# Patient Record
Sex: Female | Born: 1990 | Race: Black or African American | Hispanic: No | Marital: Single | Smoking: Never smoker
Health system: Southern US, Community
[De-identification: ages and names within clinical notes are randomized; demographics above are authoritative.]

## PROBLEM LIST (undated history)

## (undated) ENCOUNTER — Inpatient Hospital Stay (HOSPITAL_COMMUNITY): Payer: Self-pay

## (undated) DIAGNOSIS — Z8632 Personal history of gestational diabetes: Secondary | ICD-10-CM

## (undated) DIAGNOSIS — O1490 Unspecified pre-eclampsia, unspecified trimester: Secondary | ICD-10-CM

## (undated) DIAGNOSIS — E119 Type 2 diabetes mellitus without complications: Secondary | ICD-10-CM

## (undated) HISTORY — PX: NO PAST SURGERIES: SHX2092

## (undated) HISTORY — DX: Personal history of gestational diabetes: Z86.32

## (undated) HISTORY — DX: Unspecified pre-eclampsia, unspecified trimester: O14.90

---

## 2012-07-12 ENCOUNTER — Emergency Department (INDEPENDENT_AMBULATORY_CARE_PROVIDER_SITE_OTHER)
Admission: EM | Admit: 2012-07-12 | Discharge: 2012-07-12 | Disposition: A | Discharge status: Home / Self Care | Payer: Self-pay | Source: Home / Self Care | Attending: Emergency Medicine | Admitting: Emergency Medicine

## 2012-07-12 ENCOUNTER — Encounter (HOSPITAL_COMMUNITY): Payer: Self-pay | Admitting: *Deleted

## 2012-07-12 DIAGNOSIS — Z331 Pregnant state, incidental: Secondary | ICD-10-CM

## 2012-07-12 DIAGNOSIS — B373 Candidiasis of vulva and vagina: Secondary | ICD-10-CM

## 2012-07-12 DIAGNOSIS — Z3490 Encounter for supervision of normal pregnancy, unspecified, unspecified trimester: Secondary | ICD-10-CM

## 2012-07-12 LAB — POCT URINALYSIS DIP (DEVICE)
Glucose, UA: NEGATIVE mg/dL
Hgb urine dipstick: NEGATIVE
Nitrite: NEGATIVE
Urobilinogen, UA: 0.2 mg/dL (ref 0.0–1.0)
pH: 6.5 (ref 5.0–8.0)

## 2012-07-12 LAB — WET PREP, GENITAL: Clue Cells Wet Prep HPF POC: NONE SEEN

## 2012-07-12 MED ORDER — CLOTRIMAZOLE 100 MG VA TABS: 1.0000 | ORAL_TABLET | Freq: Every day | VAGINAL | Status: DC

## 2012-07-12 MED ORDER — PRENATAL VITAMINS (DIS) PO TABS: 1.0000 | ORAL_TABLET | Freq: Every day | ORAL | Status: DC

## 2012-07-12 NOTE — ED Provider Notes (Signed)
History     CSN: [id number]  Arrival date & time 07/12/12  1432   First MD Initiated Contact with Patient 07/12/12 1703      Chief Complaint  Patient presents with  . Vaginal Discharge    (Consider location/radiation/quality/duration/timing/severity/associated sxs/prior treatment) HPI Comments: After pt period was over last month, pt noticed white vaginal discharge that still persists.  Also vaginal itching.    Patient is a 21 y.o. female presenting with vaginal discharge. The history is provided by the patient.  Vaginal Discharge This is a new problem. Episode onset: a month ago. The problem occurs constantly. The problem has not changed since onset.Pertinent negatives include no abdominal pain. Nothing aggravates the symptoms. Nothing relieves the symptoms. She has tried nothing for the symptoms.    History reviewed. No pertinent past medical history.  History reviewed. No pertinent past surgical history.  History reviewed. No pertinent family history.  History  Substance Use Topics  . Smoking status: Never Smoker   . Smokeless tobacco: Not on file  . Alcohol Use: No    OB History    Grav Para Term Preterm Abortions TAB SAB Ect Mult Living                  Review of Systems  Constitutional: Negative for fever and chills.  Gastrointestinal: Negative for nausea, vomiting and abdominal pain.  Genitourinary: Positive for vaginal discharge. Negative for dysuria, flank pain and vaginal bleeding.    Allergies  Review of patient's allergies indicates no known allergies.  Home Medications   Current Outpatient Rx  Name Route Sig Dispense Refill  . CLOTRIMAZOLE 100 MG VA TABS Vaginal Place 1 tablet (100 mg total) vaginally at bedtime. 7 each 0  . PRENATAL VITAMINS (DIS) PO TABS Oral Take 1 tablet by mouth daily. 30 tablet 12    BP 127/71  Pulse 82  Temp 98.4 F (36.9 C) (Oral)  Resp 16  SpO2 100%  LMP 06/03/2012  Physical Exam  Constitutional: She appears  well-developed and well-nourished. No distress.  Cardiovascular: Normal rate and regular rhythm.   Pulmonary/Chest: Effort normal and breath sounds normal.  Abdominal: Soft. Bowel sounds are normal. She exhibits no distension. There is no tenderness. There is no guarding and no CVA tenderness.  Genitourinary: Uterus normal. There is no rash, tenderness or lesion on the right labia. There is no rash, tenderness or lesion on the left labia. Cervix exhibits no motion tenderness and no discharge. Right adnexum displays no mass and no tenderness. Left adnexum displays no mass and no tenderness. No tenderness around the vagina. Vaginal discharge found.    ED Course  Procedures (including critical care time)  Labs Reviewed  POCT URINALYSIS DIP (DEVICE) - Abnormal; Notable for the following:    Leukocytes, UA TRACE (*)  Biochemical Testing Only. Please order routine urinalysis from main lab if confirmatory testing is needed.   All other components within normal limits  POCT PREGNANCY, URINE - Abnormal; Notable for the following:    Preg Test, Ur POSITIVE (*)     All other components within normal limits  WET PREP, GENITAL - Abnormal; Notable for the following:    Yeast Wet Prep HPF POC FEW (*)     WBC, Wet Prep HPF POC MODERATE (*)     All other components within normal limits  GC/CHLAMYDIA PROBE AMP, GENITAL   No results found.   1. Pregnancy   2. Candida vaginitis       MDM  Pt with unplanned pregnancy, discussed options.  Pt wishes to keep pregnancy.  Referred to health dept, rx prenatal vitamins.  Given LMP and uterine size, likely early pregnancy.         Cathlyn Parsons, NP 07/12/12 1736

## 2012-07-12 NOTE — ED Notes (Signed)
Pt with c/o vaginal discharge x one month with itching intermittently denies odor - denies urinary symptoms - pt last menstrual cycle June 19th

## 2012-07-13 NOTE — ED Provider Notes (Addendum)
Medical screening examination/treatment/procedure(s) were performed by non-physician practitioner and as supervising physician I was immediately available for consultation/collaboration.  Leslee Home, M.D.   Reuben Likes, MD 07/13/12 1257  The patient's Chlamydia test came back positive. Her GC test was negative. We will inform the patient and treat with azithromycin 1000 mg as a single dose.  Reuben Likes, MD 07/14/12 [phone]

## 2012-07-14 ENCOUNTER — Telehealth (HOSPITAL_COMMUNITY): Payer: Self-pay | Admitting: *Deleted

## 2012-07-14 MED ORDER — AZITHROMYCIN 500 MG PO TABS: ORAL_TABLET | ORAL | Status: DC

## 2012-07-14 NOTE — ED Notes (Signed)
GC neg., Chlamydia pos., Wet prep: few yeast, mod. WBC's. Pt. adequately treated with Clotrimazole vag. for yeast but needs tx. for Chlamydia.  Lab shown to Dr. Lorenz Coaster.  He e-prescribed Zithromax 1 gm po x 1 dose to the [facility] on [address]. I called pt. and left message to call. Pt. called right back. Pt. verified x 2 and given results.  Pt. instructed to notify her partner, no sex for 1 week and to practice safe sex. Pt. told they can get HIV testing at the [facility]. STD clinic by appointment only.  Pt. told she can pick up the Rx. at [facility] on [address].  Take with a full glass of water and food. Call back if she vomits up the medication. Pt. voiced understanding.  DHHS form completed and faxed to the [facility]. Vassie Moselle 07/14/2012

## 2012-08-26 ENCOUNTER — Encounter (HOSPITAL_COMMUNITY): Payer: Self-pay | Admitting: Emergency Medicine

## 2012-08-26 DIAGNOSIS — R109 Unspecified abdominal pain: Secondary | ICD-10-CM | POA: Insufficient documentation
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
## 2012-08-26 NOTE — ED Notes (Signed)
Unable to void at this time.

## 2012-08-27 ENCOUNTER — Emergency Department (HOSPITAL_COMMUNITY)
Admission: EM | Admit: 2012-08-27 | Discharge: 2012-08-27 | Disposition: A | Discharge status: Home / Self Care | Payer: Medicaid Other | Attending: Emergency Medicine | Admitting: Emergency Medicine

## 2012-08-27 DIAGNOSIS — R109 Unspecified abdominal pain: Secondary | ICD-10-CM

## 2012-08-27 LAB — URINALYSIS, ROUTINE W REFLEX MICROSCOPIC
Nitrite: NEGATIVE
Specific Gravity, Urine: 1.014 (ref 1.005–1.030)
Urobilinogen, UA: 0.2 mg/dL (ref 0.0–1.0)

## 2012-08-27 LAB — POCT PREGNANCY, URINE: Preg Test, Ur: POSITIVE — AB

## 2012-08-27 LAB — URINE MICROSCOPIC-ADD ON

## 2012-08-27 MED ORDER — AZITHROMYCIN 250 MG PO TABS
1000.0000 mg | ORAL_TABLET | Freq: Once | ORAL | Status: AC
Start: 2012-08-27 — End: 2012-08-27
  Administered 2012-08-27: 1000 mg via ORAL
  Filled 2012-08-27: qty 4
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
## 2014-04-01 ENCOUNTER — Encounter (HOSPITAL_COMMUNITY): Payer: Self-pay | Admitting: Emergency Medicine

## 2014-04-01 ENCOUNTER — Emergency Department (HOSPITAL_COMMUNITY)
Admission: EM | Admit: 2014-04-01 | Discharge: 2014-04-02 | Disposition: A | Discharge status: Home / Self Care | Payer: Medicaid Other | Attending: Emergency Medicine | Admitting: Emergency Medicine

## 2014-04-01 DIAGNOSIS — IMO0001 Reserved for inherently not codable concepts without codable children: Secondary | ICD-10-CM | POA: Insufficient documentation

## 2014-04-01 DIAGNOSIS — R6889 Other general symptoms and signs: Secondary | ICD-10-CM

## 2014-04-01 DIAGNOSIS — Z3202 Encounter for pregnancy test, result negative: Secondary | ICD-10-CM | POA: Insufficient documentation

## 2014-04-01 DIAGNOSIS — R51 Headache: Secondary | ICD-10-CM | POA: Insufficient documentation

## 2014-04-01 DIAGNOSIS — R52 Pain, unspecified: Secondary | ICD-10-CM | POA: Insufficient documentation

## 2014-04-01 DIAGNOSIS — R1013 Epigastric pain: Secondary | ICD-10-CM | POA: Insufficient documentation

## 2014-04-01 DIAGNOSIS — R110 Nausea: Secondary | ICD-10-CM | POA: Insufficient documentation

## 2014-04-01 DIAGNOSIS — I10 Essential (primary) hypertension: Secondary | ICD-10-CM | POA: Insufficient documentation

## 2014-04-01 LAB — COMPREHENSIVE METABOLIC PANEL
ALK PHOS: 69 U/L (ref 39–117)
ALT: 14 U/L (ref 0–35)
AST: 19 U/L (ref 0–37)
Albumin: 4.1 g/dL (ref 3.5–5.2)
BUN: 9 mg/dL (ref 6–23)
CALCIUM: 9.7 mg/dL (ref 8.4–10.5)
CO2: 21 meq/L (ref 19–32)
Chloride: 107 mEq/L (ref 96–112)
Creatinine, Ser: 0.76 mg/dL (ref 0.50–1.10)
GFR calc Af Amer: >90 mL/min (ref >90)
GFR calc non Af Amer: >90 mL/min (ref >90)
GLUCOSE: 101 mg/dL — AB (ref 70–99)
POTASSIUM: 3.7 meq/L (ref 3.7–5.3)
SODIUM: 142 meq/L (ref 137–147)
TOTAL PROTEIN: 7.7 g/dL (ref 6.0–8.3)
Total Bilirubin: 0.2 mg/dL — ABNORMAL LOW (ref 0.3–1.2)

## 2014-04-01 LAB — CBC WITH DIFFERENTIAL/PLATELET
BASOS ABS: 0 10*3/uL (ref 0.0–0.1)
Basophils Relative: 0 % (ref 0–1)
EOS ABS: 0 10*3/uL (ref 0.0–0.7)
EOS PCT: 0 % (ref 0–5)
HCT: 39.6 % (ref 36.0–46.0)
Hemoglobin: 13.1 g/dL (ref 12.0–15.0)
LYMPHS ABS: 0.9 10*3/uL (ref 0.7–4.0)
LYMPHS PCT: 19 % (ref 12–46)
MCH: 27.4 pg (ref 26.0–34.0)
MCHC: 33.1 g/dL (ref 30.0–36.0)
MCV: 82.8 fL (ref 78.0–100.0)
Monocytes Absolute: 0.3 10*3/uL (ref 0.1–1.0)
Monocytes Relative: 7 % (ref 3–12)
NEUTROS PCT: 74 % (ref 43–77)
Neutro Abs: 3.4 10*3/uL (ref 1.7–7.7)
PLATELETS: 333 10*3/uL (ref 150–400)
RBC: 4.78 MIL/uL (ref 3.87–5.11)
RDW: 14.5 % (ref 11.5–15.5)
WBC: 4.6 10*3/uL (ref 4.0–10.5)

## 2014-04-01 LAB — LIPASE, BLOOD: Lipase: 38 U/L (ref 11–59)

## 2014-04-01 NOTE — ED Notes (Signed)
PT presents with body aches and HA onset 4 days ago. N/v/d present past few days.

## 2014-04-01 NOTE — ED Notes (Signed)
PT states she is holding down liquids PO at this time. Complains of HA and stomach cramping, body aches. Mucous membranes moist, no furrowing, skin non-tenting

## 2014-04-01 NOTE — ED Notes (Signed)
Patient presents with body aches, cough, nausea and headache.  Has been going on for 3 days.

## 2014-04-02 LAB — URINALYSIS, ROUTINE W REFLEX MICROSCOPIC
Bilirubin Urine: NEGATIVE
GLUCOSE, UA: NEGATIVE mg/dL
HGB URINE DIPSTICK: NEGATIVE
Ketones, ur: 15 mg/dL — AB
LEUKOCYTES UA: NEGATIVE
Nitrite: NEGATIVE
PH: 7 (ref 5.0–8.0)
Protein, ur: NEGATIVE mg/dL
Specific Gravity, Urine: 1.029 (ref 1.005–1.030)
Urobilinogen, UA: 1 mg/dL (ref 0.0–1.0)

## 2014-04-02 LAB — PREGNANCY, URINE: Preg Test, Ur: NEGATIVE

## 2014-04-02 MED ORDER — ONDANSETRON 8 MG PO TBDP: 8.0000 mg | ORAL_TABLET | Freq: Three times a day (TID) | ORAL | Status: DC | PRN

## 2014-04-02 MED ORDER — LABETALOL HCL 5 MG/ML IV SOLN: 40.0000 mg | Freq: Once | INTRAVENOUS | Status: DC

## 2014-04-02 MED ORDER — LABETALOL HCL 5 MG/ML IV SOLN: 80.0000 mg | Freq: Once | INTRAVENOUS | Status: DC

## 2014-04-02 MED ORDER — LABETALOL HCL 5 MG/ML IV SOLN: 20.0000 mg | Freq: Once | INTRAVENOUS | Status: DC

## 2014-04-02 MED ORDER — IBUPROFEN 400 MG PO TABS: 400.0000 mg | ORAL_TABLET | Freq: Four times a day (QID) | ORAL | Status: DC | PRN

## 2014-04-02 MED ORDER — METOCLOPRAMIDE HCL 5 MG/ML IJ SOLN
10.0000 mg | Freq: Once | INTRAMUSCULAR | Status: AC
  Administered 2014-04-02: 10 mg via INTRAVENOUS
  Filled 2014-04-02: qty 2

## 2014-04-02 MED ORDER — SODIUM CHLORIDE 0.9 % IV BOLUS (SEPSIS)
1000.0000 mL | Freq: Once | INTRAVENOUS | Status: AC
  Administered 2014-04-02: 1000 mL via INTRAVENOUS

## 2014-04-02 MED ORDER — KETOROLAC TROMETHAMINE 30 MG/ML IJ SOLN
30.0000 mg | Freq: Once | INTRAMUSCULAR | Status: AC
  Administered 2014-04-02: 30 mg via INTRAVENOUS
  Filled 2014-04-02: qty 1

## 2014-04-02 NOTE — Discharge Instructions (Signed)
Viral Infections °A virus is a type of germ. Viruses can cause: °· Minor sore throats. °· Aches and pains. °· Headaches. °· Runny nose. °· Rashes. °· Watery eyes. °· Tiredness. °· Coughs. °· Loss of appetite. °· Feeling sick to your stomach (nausea). °· Throwing up (vomiting). °· Watery poop (diarrhea). °HOME CARE  °· Only take medicines as told by your doctor. °· Drink enough water and fluids to keep your pee (urine) clear or pale yellow. Sports drinks are a good choice. °· Get plenty of rest and eat healthy. Soups and broths with crackers or rice are fine. °GET HELP RIGHT AWAY IF:  °· You have a very bad headache. °· You have shortness of breath. °· You have chest pain or neck pain. °· You have an unusual rash. °· You cannot stop throwing up. °· You have watery poop that does not stop. °· You cannot keep fluids down. °· You or your child has a temperature by mouth above 102° F (38.9° C), not controlled by medicine. °· Your baby is older than 3 months with a rectal temperature of 102° F (38.9° C) or higher. °· Your baby is 3 months old or younger with a rectal temperature of 100.4° F (38° C) or higher. °MAKE SURE YOU:  °· Understand these instructions. °· Will watch this condition. °· Will get help right away if you are not doing well or get worse. °Document Released: 11/14/2008 Document Revised: 02/24/2012 Document Reviewed: 04/09/2011 °Exit[employer]® Patient Information ©2014 [employer]. ° °

## 2014-04-02 NOTE — ED Provider Notes (Signed)
CSN: [id number]     Arrival date & time 04/01/14  2044 History   First MD Initiated Contact with Patient 04/01/14 2305     Chief Complaint  Patient presents with  . Generalized Body Aches  . Headache  . Nausea     (Consider location/radiation/quality/duration/timing/severity/associated sxs/prior Treatment) HPI Comments: Pt comes in w/ headache and body aches. Generalized body aches x 2 days, with malaise. Headache started 1 day aho. No fevers. No vomiting, visual complains, seizures, altered mental status, loss of consciousness, new weakness, or numbness, no gait instability.   Patient is a 23 y.o. female presenting with headaches. The history is provided by the patient.  Headache Pain location:  L temporal Quality:  Sharp Radiates to:  Does not radiate Severity currently:  7/10 Severity at highest:  9/10 Onset quality:  Gradual Duration:  1 day Timing:  Constant Progression:  Waxing and waning Chronicity:  New Similar to prior headaches: yes   Context: not exposure to bright light   Relieved by:  None tried Ineffective treatments:  None tried Associated symptoms: abdominal pain, myalgias and nausea   Associated symptoms: no near-syncope, no neck pain, no neck stiffness, no numbness, no paresthesias, no photophobia, no seizures, no visual change and no vomiting     Past Medical History  Diagnosis Date  . Hypertension    History reviewed. No pertinent past surgical history. History reviewed. No pertinent family history. History  Substance Use Topics  . Smoking status: Never Smoker   . Smokeless tobacco: Never Used  . Alcohol Use: No   OB History   Grav Para Term Preterm Abortions TAB SAB Ect Mult Living   1              Review of Systems  Constitutional: Positive for activity change.  Eyes: Negative for photophobia.  Respiratory: Negative for shortness of breath.   Cardiovascular: Negative for chest pain and near-syncope.  Gastrointestinal: Positive for nausea  and abdominal pain. Negative for vomiting.  Genitourinary: Negative for dysuria.  Musculoskeletal: Positive for myalgias. Negative for neck pain and neck stiffness.  Neurological: Positive for headaches. Negative for seizures, numbness and paresthesias.  All other systems reviewed and are negative.     Allergies  Review of patient's allergies indicates no known allergies.  Home Medications   Prior to Admission medications   Medication Sig Start Date End Date Taking? Authorizing Provider  medroxyPROGESTERone (DEPO-SUBQ PROVERA) 104 MG/0.65ML injection Inject 104 mg into the skin every 3 (three) months.   Yes Historical Provider, MD  ibuprofen (ADVIL,MOTRIN) 400 MG tablet Take 1 tablet (400 mg total) by mouth every 6 (six) hours as needed. 04/02/14   Derwood KaplanAnkit Cornisha Zetino, MD  ondansetron (ZOFRAN ODT) 8 MG disintegrating tablet Take 1 tablet (8 mg total) by mouth every 8 (eight) hours as needed for nausea. 04/02/14   Zenovia Justman Rhunette CroftNanavati, MD   BP 127/92  Pulse 95  Temp(Src) 98.5 F (36.9 C) (Oral)  Resp 25  Ht 5\' 4"  (1.626 m)  Wt 150 lb (68.04 kg)  BMI 25.73 kg/m2  SpO2 100%  LMP 03/25/2014  Breastfeeding? Unknown Physical Exam  Nursing note and vitals reviewed. Constitutional: She is oriented to person, place, and time. She appears well-developed and well-nourished.  HENT:  Head: Normocephalic and atraumatic.  Eyes: EOM are normal. Pupils are equal, round, and reactive to light.  Neck: Neck supple.  Cardiovascular: Normal rate, regular rhythm and normal heart sounds.   No murmur heard. Pulmonary/Chest: Effort normal. No respiratory distress.  Abdominal: Soft. She exhibits no distension. There is tenderness. There is no rebound and no guarding.  epigastric  Neurological: She is alert and oriented to person, place, and time.  Skin: Skin is warm and dry.    ED Course  Procedures (including critical care time) Labs Review Labs Reviewed  COMPREHENSIVE METABOLIC PANEL - Abnormal; Notable  for the following:    Glucose, Bld 101 (*)    Total Bilirubin 0.2 (*)    All other components within normal limits  URINALYSIS, ROUTINE W REFLEX MICROSCOPIC - Abnormal; Notable for the following:    Ketones, ur 15 (*)    All other components within normal limits  CBC WITH DIFFERENTIAL  LIPASE, BLOOD  PREGNANCY, URINE    Imaging Review No results found.   EKG Interpretation None      MDM   Final diagnoses:  Flu-like symptoms    Pt comes in with headaches, body aches, and malaise. No source of infection illcited on hx - although she does report a dry cough and some mild uri like sx, and mild epigastric pain and nausea.  Headache resolved with iv fluids and headache cocktails. She has no red flags on hx or exam suggestive of intracranial infections. Labs are reassuring, or at least not concerning.  Instruction on flu like syndrome, with return precautions have been discussed.     Derwood KaplanAnkit Sherrise Liberto, MD 04/02/14 [phone]

## 2014-10-17 ENCOUNTER — Encounter (HOSPITAL_COMMUNITY): Payer: Self-pay | Admitting: Emergency Medicine

## 2015-05-27 ENCOUNTER — Emergency Department (HOSPITAL_COMMUNITY)
Admission: EM | Admit: 2015-05-27 | Discharge: 2015-05-28 | Disposition: A | Discharge status: Home / Self Care | Payer: Medicaid Other | Attending: Emergency Medicine | Admitting: Emergency Medicine

## 2015-05-27 ENCOUNTER — Emergency Department (HOSPITAL_COMMUNITY): Payer: Medicaid Other

## 2015-05-27 ENCOUNTER — Encounter (HOSPITAL_COMMUNITY): Payer: Self-pay | Admitting: Emergency Medicine

## 2015-05-27 DIAGNOSIS — W25XXXA Contact with sharp glass, initial encounter: Secondary | ICD-10-CM | POA: Insufficient documentation

## 2015-05-27 DIAGNOSIS — Y9289 Other specified places as the place of occurrence of the external cause: Secondary | ICD-10-CM | POA: Insufficient documentation

## 2015-05-27 DIAGNOSIS — I10 Essential (primary) hypertension: Secondary | ICD-10-CM | POA: Insufficient documentation

## 2015-05-27 DIAGNOSIS — Y9389 Activity, other specified: Secondary | ICD-10-CM | POA: Insufficient documentation

## 2015-05-27 DIAGNOSIS — S71111A Laceration without foreign body, right thigh, initial encounter: Secondary | ICD-10-CM | POA: Insufficient documentation

## 2015-05-27 DIAGNOSIS — Z23 Encounter for immunization: Secondary | ICD-10-CM | POA: Insufficient documentation

## 2015-05-27 DIAGNOSIS — S71131A Puncture wound without foreign body, right thigh, initial encounter: Secondary | ICD-10-CM

## 2015-05-27 DIAGNOSIS — S8991XA Unspecified injury of right lower leg, initial encounter: Secondary | ICD-10-CM | POA: Insufficient documentation

## 2015-05-27 DIAGNOSIS — Z793 Long term (current) use of hormonal contraceptives: Secondary | ICD-10-CM | POA: Insufficient documentation

## 2015-05-27 DIAGNOSIS — Y998 Other external cause status: Secondary | ICD-10-CM | POA: Insufficient documentation

## 2015-05-27 DIAGNOSIS — T1490XA Injury, unspecified, initial encounter: Secondary | ICD-10-CM

## 2015-05-27 DIAGNOSIS — S71132A Puncture wound without foreign body, left thigh, initial encounter: Secondary | ICD-10-CM | POA: Insufficient documentation

## 2015-05-27 MED ORDER — IBUPROFEN 800 MG PO TABS
800.0000 mg | ORAL_TABLET | Freq: Once | ORAL | Status: AC
  Administered 2015-05-27: 800 mg via ORAL
  Filled 2015-05-27: qty 1

## 2015-05-27 MED ORDER — NAPROXEN 500 MG PO TABS: 500.0000 mg | ORAL_TABLET | Freq: Two times a day (BID) | ORAL | Status: DC

## 2015-05-27 MED ORDER — CEPHALEXIN 500 MG PO CAPS: 500.0000 mg | ORAL_CAPSULE | Freq: Two times a day (BID) | ORAL | Status: DC

## 2015-05-27 MED ORDER — TETANUS-DIPHTH-ACELL PERTUSSIS 5-2.5-18.5 LF-MCG/0.5 IM SUSP
0.5000 mL | Freq: Once | INTRAMUSCULAR | Status: AC
  Administered 2015-05-27: 0.5 mL via INTRAMUSCULAR
  Filled 2015-05-27: qty 0.5

## 2015-05-27 MED ORDER — BACITRACIN ZINC 500 UNIT/GM EX OINT: 1.0000 "application " | TOPICAL_OINTMENT | Freq: Two times a day (BID) | CUTANEOUS | Status: DC

## 2015-05-27 MED ORDER — BACITRACIN 500 UNIT/GM EX OINT
1.0000 | TOPICAL_OINTMENT | Freq: Once | CUTANEOUS | Status: AC
Start: 2015-05-27 — End: 2015-05-27
  Administered 2015-05-27: 1 via TOPICAL
  Filled 2015-05-27: qty 28.4

## 2015-05-27 NOTE — Discharge Instructions (Signed)
Please call your doctor for a followup appointment within 24-48 hours. When you talk to your doctor please let them know that you were seen in the emergency department and have them acquire all of your records so that they can discuss the findings with you and formulate a treatment plan to fully care for your new and ongoing problems. ° °

## 2015-05-27 NOTE — ED Notes (Signed)
Per EMS:  Pt was carrying a broken mirror down the stairs to the trash today when she struck both knees, causing puncture marks to both.  Laceration to left knee less severe than right.  Hemorrhage controlled at this time.  Pt cannot recall her last tetanus shot.

## 2015-05-27 NOTE — ED Provider Notes (Signed)
CSN: [id number]     Arrival date & time 05/27/15  2141 History   First MD Initiated Contact with Patient 05/27/15 2207     Chief Complaint  Patient presents with  . Extremity Laceration     (Consider location/radiation/quality/duration/timing/severity/associated sxs/prior Treatment) HPI Comments: 24 year old female who was carrying a broken mirror down the stairs, she slipped in the mirror punctured her bilateral thighs distally anteriorly. This was acute in onset, occurred just prior to arrival, pain is constant, associated with small amount of bleeding and a small amount of swelling to the right knee. No other injuries, not up-to-date on tetanus.  The history is provided by the patient.    Past Medical History  Diagnosis Date  . Hypertension    History reviewed. No pertinent past surgical history. History reviewed. No pertinent family history. History  Substance Use Topics  . Smoking status: Never Smoker   . Smokeless tobacco: Never Used  . Alcohol Use: No   OB History    Gravida Para Term Preterm AB TAB SAB Ectopic Multiple Living   1              Review of Systems  Constitutional: Negative for fever.  Gastrointestinal: Negative for vomiting.  Skin: Positive for wound.       Laceration  Neurological: Negative for weakness and numbness.      Allergies  Review of patient's allergies indicates no known allergies.  Home Medications   Prior to Admission medications   Medication Sig Start Date End Date Taking? Authorizing Provider  medroxyPROGESTERone (DEPO-SUBQ PROVERA) 104 MG/0.65ML injection Inject 104 mg into the skin every 3 (three) months.   Yes Historical Provider, MD  bacitracin ointment Apply 1 application topically 2 (two) times daily. 05/27/15   Eber Hong, MD  cephALEXin (KEFLEX) 500 MG capsule Take 1 capsule (500 mg total) by mouth 2 (two) times daily. 05/27/15   Eber Hong, MD  ibuprofen (ADVIL,MOTRIN) 400 MG tablet Take 1 tablet (400 mg total) by  mouth every 6 (six) hours as needed. 04/02/14   Derwood Kaplan, MD  naproxen (NAPROSYN) 500 MG tablet Take 1 tablet (500 mg total) by mouth 2 (two) times daily with a meal. 05/27/15   Eber Hong, MD  ondansetron (ZOFRAN ODT) 8 MG disintegrating tablet Take 1 tablet (8 mg total) by mouth every 8 (eight) hours as needed for nausea. 04/02/14   Ankit Nanavati, MD   BP 118/68 mmHg  Pulse 80  Temp(Src) 98.4 F (36.9 C) (Oral)  Resp 16  Ht  (1.6 m)  Wt 150 lb (68.04 kg)  BMI 26.58 kg/m2  SpO2 98%  LMP  (LMP Unknown) Physical Exam  Constitutional: She appears well-developed and well-nourished.  HENT:  Head: Normocephalic and atraumatic.  Eyes: Conjunctivae are normal. Right eye exhibits no discharge. Left eye exhibits no discharge.  Pulmonary/Chest: Effort normal. No respiratory distress.  Musculoskeletal:  The patient has a very tiny puncture wound to the left lower anterior thigh, there is no visible opening, no bleeding, no swelling, no tenderness over this. There is a half centimeter laceration to the right distal anterior thigh, there is no active bleeding but a small hematoma is present with tenderness. Normal range of motion of the bilateral lower extremities at the hips knees and ankles, no joint swelling or effusions, normal pulses, normal sensation.  Neurological: She is alert. Coordination normal.  Skin: Skin is warm and dry. No rash noted. She is not diaphoretic. No erythema.  Psychiatric: She has a  normal mood and affect.  Nursing note and vitals reviewed.   ED Course  Procedures (including critical care time) Labs Review Labs Reviewed - No data to display  Imaging Review Dg Knee Complete 4 Views Right  05/27/2015   CLINICAL DATA:  Lacerations to the right knee from glass. Initial encounter.  EXAM: RIGHT KNEE - COMPLETE 4+ VIEW  COMPARISON:  None.  FINDINGS: There is no evidence of fracture or dislocation. The joint spaces are preserved. No significant degenerative change  is seen; the patellofemoral joint is grossly unremarkable in appearance. A fabella is noted.  No significant joint effusion is seen. The known soft tissue laceration is not well characterized. There is question of minimal soft tissue air overlying the quadriceps tendon. No radiopaque foreign bodies are seen.  IMPRESSION: No evidence of fracture or dislocation. No radiopaque foreign bodies seen.   Electronically Signed   By: Roanna Raider M.D.   On: 05/27/2015 23:44     MDM   Final diagnoses:  Trauma  Puncture wound of thigh, right, initial encounter    Imaging to rule out foreign bodies, pain medication, update tetanus, no need for closure given the nature of the puncture wound which is very small. Would breed infection  No FB on xray  I have personally seen and reviewed and interpreted the x-ray, there is no signs of foreign body or fracture. I agree with the radiologist interpretation. Wound care, bacitracin, dressings, patient given instructions on proper care  Meds given in ED:  Medications  Tdap (BOOSTRIX) injection 0.5 mL (0.5 mLs Intramuscular Given 05/27/15 2321)  ibuprofen (ADVIL,MOTRIN) tablet 800 mg (800 mg Oral Given 05/27/15 2321)  bacitracin ointment 1 application (1 application Topical Given 05/27/15 2352)    Discharge Medication List as of 05/27/2015 11:14 PM    START taking these medications   Details  bacitracin ointment Apply 1 application topically 2 (two) times daily., Starting 05/27/2015, Until Discontinued, Print    naproxen (NAPROSYN) 500 MG tablet Take 1 tablet (500 mg total) by mouth 2 (two) times daily with a meal., Starting 05/27/2015, Until Discontinued, Print          Eber Hong, MD 05/29/15 1202

## 2017-10-13 ENCOUNTER — Encounter (HOSPITAL_COMMUNITY): Payer: Self-pay | Admitting: *Deleted

## 2017-10-13 ENCOUNTER — Inpatient Hospital Stay (HOSPITAL_COMMUNITY): Payer: Self-pay

## 2017-10-13 ENCOUNTER — Inpatient Hospital Stay (HOSPITAL_COMMUNITY)
Admission: AD | Admit: 2017-10-13 | Discharge: 2017-10-13 | Disposition: A | Discharge status: Home / Self Care | Payer: Self-pay | Source: Ambulatory Visit | Attending: Obstetrics & Gynecology | Admitting: Obstetrics & Gynecology

## 2017-10-13 DIAGNOSIS — N760 Acute vaginitis: Secondary | ICD-10-CM

## 2017-10-13 DIAGNOSIS — O26891 Other specified pregnancy related conditions, first trimester: Secondary | ICD-10-CM | POA: Insufficient documentation

## 2017-10-13 DIAGNOSIS — O23592 Infection of other part of genital tract in pregnancy, second trimester: Secondary | ICD-10-CM | POA: Insufficient documentation

## 2017-10-13 DIAGNOSIS — O219 Vomiting of pregnancy, unspecified: Secondary | ICD-10-CM | POA: Insufficient documentation

## 2017-10-13 DIAGNOSIS — B9689 Other specified bacterial agents as the cause of diseases classified elsewhere: Secondary | ICD-10-CM

## 2017-10-13 DIAGNOSIS — R109 Unspecified abdominal pain: Secondary | ICD-10-CM | POA: Insufficient documentation

## 2017-10-13 DIAGNOSIS — Z3A01 Less than 8 weeks gestation of pregnancy: Secondary | ICD-10-CM | POA: Insufficient documentation

## 2017-10-13 DIAGNOSIS — Z3491 Encounter for supervision of normal pregnancy, unspecified, first trimester: Secondary | ICD-10-CM

## 2017-10-13 DIAGNOSIS — O26899 Other specified pregnancy related conditions, unspecified trimester: Secondary | ICD-10-CM

## 2017-10-13 LAB — WET PREP, GENITAL
Sperm: NONE SEEN
Trich, Wet Prep: NONE SEEN
Yeast Wet Prep HPF POC: NONE SEEN

## 2017-10-13 LAB — HCG, QUANTITATIVE, PREGNANCY: HCG, BETA CHAIN, QUANT, S: 24059 m[IU]/mL — AB (ref <5)

## 2017-10-13 LAB — URINALYSIS, ROUTINE W REFLEX MICROSCOPIC
Bilirubin Urine: NEGATIVE
Glucose, UA: NEGATIVE mg/dL
HGB URINE DIPSTICK: NEGATIVE
Ketones, ur: NEGATIVE mg/dL
Leukocytes, UA: NEGATIVE
Nitrite: NEGATIVE
Protein, ur: NEGATIVE mg/dL
SPECIFIC GRAVITY, URINE: 1.029 (ref 1.005–1.030)
pH: 5 (ref 5.0–8.0)

## 2017-10-13 LAB — CBC
HCT: 33.1 % — ABNORMAL LOW (ref 36.0–46.0)
HEMOGLOBIN: 11.4 g/dL — AB (ref 12.0–15.0)
MCH: 29.9 pg (ref 26.0–34.0)
MCHC: 34.4 g/dL (ref 30.0–36.0)
MCV: 86.9 fL (ref 78.0–100.0)
PLATELETS: 316 10*3/uL (ref 150–400)
RBC: 3.81 MIL/uL — AB (ref 3.87–5.11)
RDW: 13.6 % (ref 11.5–15.5)
WBC: 8.6 10*3/uL (ref 4.0–10.5)

## 2017-10-13 LAB — POCT PREGNANCY, URINE: PREG TEST UR: POSITIVE — AB

## 2017-10-13 MED ORDER — METRONIDAZOLE 500 MG PO TABS: 500.0000 mg | ORAL_TABLET | Freq: Two times a day (BID) | ORAL | 0 refills | Status: DC

## 2017-10-13 NOTE — Discharge Instructions (Signed)
Abdominal Pain During Pregnancy °Abdominal pain is common in pregnancy. Most of the time, it does not cause harm. There are many causes of abdominal pain. Some causes are more serious than others and sometimes the cause is not known. Abdominal pain can be a sign that something is very wrong with the pregnancy or the pain may have nothing to do with the pregnancy. Always tell your health care provider if you have any abdominal pain. °Follow these instructions at home: °· Do not have sex or put anything in your vagina until your symptoms go away completely. °· Watch your abdominal pain for any changes. °· Get plenty of rest until your pain improves. °· Drink enough fluid to keep your urine clear or pale yellow. °· Take over-the-counter or prescription medicines only as told by your health care provider. °· Keep all follow-up visits as told by your health care provider. This is important. °Contact a health care provider if: °· You have a fever. °· Your pain gets worse or you have cramping. °· Your pain continues after resting. °Get help right away if: °· You are bleeding, leaking fluid, or passing tissue from the vagina. °· You have vomiting or diarrhea that does not go away. °· You have painful or bloody urination. °· You notice a decrease in your baby's movements. °· You feel very weak or faint. °· You have shortness of breath. °· You develop a severe headache with abdominal pain. °· You have abnormal vaginal discharge with abdominal pain. °This information is not intended to replace advice given to you by your health care provider. Make sure you discuss any questions you have with your health care provider. °Document Released: 12/02/2005 Document Revised: 09/12/2016 Document Reviewed: 07/01/2013 °Elsevier Interactive Patient Education © 2018 [employer] ° °Bacterial Vaginosis °Bacterial vaginosis is an infection of the vagina. It happens when too many germs (bacteria) grow in the vagina. This infection puts you at  risk for infections from sex (STIs). Treating this infection can lower your risk for some STIs. You should also treat this if you are pregnant. It can cause your baby to be born early. °Follow these instructions at home: °Medicines °· Take over-the-counter and prescription medicines only as told by your doctor. °· Take or use your antibiotic medicine as told by your doctor. Do not stop taking or using it even if you start to feel better. °General instructions °· If you your sexual partner is a woman, tell her that you have this infection. She needs to get treatment if she has symptoms. If you have a female partner, he does not need to be treated. °· During treatment: °? Avoid sex. °? Do not douche. °? Avoid alcohol as told. °? Avoid breastfeeding as told. °· Drink enough fluid to keep your pee (urine) clear or pale yellow. °· Keep your vagina and butt (rectum) clean. °? Wash the area with warm water every day. °? Wipe from front to back after you use the toilet. °· Keep all follow-up visits as told by your doctor. This is important. °Preventing this condition °· Do not douche. °· Use only warm water to wash around your vagina. °· Use protection when you have sex. This includes: °? Latex condoms. °? Dental dams. °· Limit how many people you have sex with. It is best to only have sex with the same person (be monogamous). °· Get tested for STIs. Have your partner get tested. °· Wear underwear that is cotton or lined with cotton. °· Avoid   tight pants and pantyhose. This is most important in summer. °· Do not use any products that have nicotine or tobacco in them. These include cigarettes and e-cigarettes. If you need help quitting, ask your doctor. °· Do not use illegal drugs. °· Limit how much alcohol you drink. °Contact a doctor if: °· Your symptoms do not get better, even after you are treated. °· You have more discharge or pain when you pee (urinate). °· You have a fever. °· You have pain in your belly  (abdomen). °· You have pain with sex. °· Your bleed from your vagina between periods. °Summary °· This infection happens when too many germs (bacteria) grow in the vagina. °· Treating this condition can lower your risk for some infections from sex (STIs). °· You should also treat this if you are pregnant. It can cause early (premature) birth. °· Do not stop taking or using your antibiotic medicine even if you start to feel better. °This information is not intended to replace advice given to you by your health care provider. Make sure you discuss any questions you have with your health care provider. °Document Released: 09/10/2008 Document Revised: 08/17/2016 Document Reviewed: 08/17/2016 °Elsevier Interactive Patient Education © 2017 [employer] ° °

## 2017-10-13 NOTE — MAU Provider Note (Signed)
History     CSN: [id number]  Arrival date and time: 10/13/17 1544   None     Chief Complaint  Patient presents with  . Possible Pregnancy  . Abdominal Pain  . Nausea   G3P1011 @[redacted]w[redacted]d  by LMP here with N/V and LAP. N/V started 1 week ago. Vomiting occurs every other day on average. She has not used anything OTC for it. LAP started last week. Describes as sharp and intermittent in bilateral lower abdomen. No urinary sx. Pain doesn't happen daily. Eating improves the pain. No aggravating factors. Reports a creamy malodorous vaginal discharge prior to pregnancy. No new partner in 7 years, hx of Chlamydia about 4 years ago.   OB History    Gravida Para Term Preterm AB Living   3 1 1   1 1    SAB TAB Ectopic Multiple Live Births     1     1      Past Medical History:  Diagnosis Date  . Hypertension     History reviewed. No pertinent surgical history.  Family History  Problem Relation Age of Onset  . Diabetes Mother   . Hypertension Mother   . Hypertension Sister   . Diabetes Maternal Aunt   . Hypertension Maternal Aunt     Social History  Substance Use Topics  . Smoking status: Never Smoker  . Smokeless tobacco: Never Used  . Alcohol use No    Allergies: No Known Allergies  No prescriptions prior to admission.    Review of Systems  Constitutional: Negative for chills and fever.  Gastrointestinal: Positive for abdominal pain, nausea and vomiting. Negative for constipation and diarrhea.  Genitourinary: Positive for vaginal discharge. Negative for dysuria and vaginal bleeding.   Physical Exam   Blood pressure 123/70, pulse 84, temperature 98.2 F (36.8 C), temperature source Oral, resp. rate 16, height 5\' 4"  (1.626 m), weight 173 lb 8 oz (78.7 kg), last menstrual period 09/11/2017, SpO2 100 %, unknown if currently breastfeeding.  Physical Exam  Constitutional: She is oriented to person, place, and time. She appears well-developed and well-nourished. No distress.   HENT:  Head: Normocephalic and atraumatic.  Neck: Normal range of motion.  Cardiovascular: Normal rate.   Respiratory: Effort normal.  GI: Soft. She exhibits no distension and no mass. There is no tenderness. There is no rebound and no guarding.  Genitourinary:  Genitourinary Comments: External: no lesions or erythema Vagina: rugated, pink, moist, small creamy white discharge Uterus: non enlarged, anteverted, non tender, no CMT Adnexae: no masses, no tenderness left, no tenderness right   Musculoskeletal: Normal range of motion.  Neurological: She is alert and oriented to person, place, and time.  Skin: Skin is warm and dry.  Psychiatric: She has a normal mood and affect.   Results for orders placed or performed during the hospital encounter of 10/13/17 (from the past 24 hour(s))  Urinalysis, Routine w reflex microscopic     Status: None   Collection Time: 10/13/17  3:55 PM  Result Value Ref Range   Color, Urine YELLOW YELLOW   APPearance CLEAR CLEAR   Specific Gravity, Urine 1.029 1.005 - 1.030   pH 5.0 5.0 - 8.0   Glucose, UA NEGATIVE NEGATIVE mg/dL   Hgb urine dipstick NEGATIVE NEGATIVE   Bilirubin Urine NEGATIVE NEGATIVE   Ketones, ur NEGATIVE NEGATIVE mg/dL   Protein, ur NEGATIVE NEGATIVE mg/dL   Nitrite NEGATIVE NEGATIVE   Leukocytes, UA NEGATIVE NEGATIVE  Pregnancy, urine POC     Status:  Abnormal   Collection Time: 10/13/17  4:22 PM  Result Value Ref Range   Preg Test, Ur POSITIVE (A) NEGATIVE  Wet prep, genital     Status: Abnormal   Collection Time: 10/13/17  4:37 PM  Result Value Ref Range   Yeast Wet Prep HPF POC NONE SEEN NONE SEEN   Trich, Wet Prep NONE SEEN NONE SEEN   Clue Cells Wet Prep HPF POC PRESENT (A) NONE SEEN   WBC, Wet Prep HPF POC MODERATE (A) NONE SEEN   Sperm NONE SEEN   CBC     Status: Abnormal   Collection Time: 10/13/17  4:55 PM  Result Value Ref Range   WBC 8.6 4.0 - 10.5 K/uL   RBC 3.81 (L) 3.87 - 5.11 MIL/uL   Hemoglobin 11.4 (L)  12.0 - 15.0 g/dL   HCT 16.1 (L) 09.6 - 04.5 %   MCV 86.9 78.0 - 100.0 fL   MCH 29.9 26.0 - 34.0 pg   MCHC 34.4 30.0 - 36.0 g/dL   RDW 40.9 81.1 - 91.4 %   Platelets 316 150 - 400 K/uL  hCG, quantitative, pregnancy     Status: Abnormal   Collection Time: 10/13/17  4:55 PM  Result Value Ref Range   hCG, Beta Chain, Quant, S 24,059 (H) <5 mIU/mL   US Ob Comp Less 14 Wks  Result Date: 10/13/2017 CLINICAL DATA:  Low pelvic pain for 1 week with positive pregnancy test. EXAM: OBSTETRIC <14 WK [place] AND TRANSVAGINAL OB US TECHNIQUE: Both transabdominal and transvaginal ultrasound examinations were performed for complete evaluation of the gestation as well as the maternal uterus, adnexal regions, and pelvic cul-de-sac. Transvaginal technique was performed to assess early pregnancy. COMPARISON:  None. FINDINGS: Intrauterine gestational sac: Single. Yolk sac:  Visualized. Embryo:  Visualized. Cardiac Activity: Visualized. Heart Rate: 155  bpm CRL:  2.3  mm   5 w   5 d                  [place] EDC: 06/10/2018 Subchorionic hemorrhage:  None visualized. Maternal uterus/adnexae: Unremarkable IMPRESSION: Single living intrauterine gestation at estimated 5 week 5 day gestational age by crown-rump length. Electronically Signed   By: Kennith Center M.D.   On: 10/13/2017 18:04   US Ob Transvaginal  Result Date: 10/13/2017 CLINICAL DATA:  Low pelvic pain for 1 week with positive pregnancy test. EXAM: OBSTETRIC <14 WK [place] AND TRANSVAGINAL OB US TECHNIQUE: Both transabdominal and transvaginal ultrasound examinations were performed for complete evaluation of the gestation as well as the maternal uterus, adnexal regions, and pelvic cul-de-sac. Transvaginal technique was performed to assess early pregnancy. COMPARISON:  None. FINDINGS: Intrauterine gestational sac: Single. Yolk sac:  Visualized. Embryo:  Visualized. Cardiac Activity: Visualized. Heart Rate: 155  bpm CRL:  2.3  mm   5 w   5 d                  [place] EDC: 06/10/2018  Subchorionic hemorrhage:  None visualized. Maternal uterus/adnexae: Unremarkable IMPRESSION: Single living intrauterine gestation at estimated 5 week 5 day gestational age by crown-rump length. Electronically Signed   By: Kennith Center M.D.   On: 10/13/2017 18:04   MAU Course  Procedures  MDM Labs and [place] ordered and reviewed. Normal IUP on [place]. No UTI. Will treat for BV. Stable for discharge home.  Assessment and Plan   1. Normal intrauterine pregnancy on prenatal ultrasound in first trimester   2. Abdominal pain affecting pregnancy  3. Bacterial vaginosis    Discharge home Follow up with OB provider of choice in 4-5 wks- list provided Pregnancy verification list provided Rx Flagyl Return precautions  Allergies as of 10/13/2017   No Known Allergies     Medication List    TAKE these medications   metroNIDAZOLE 500 MG tablet Commonly known as:  FLAGYL Take 1 tablet (500 mg total) by mouth 2 (two) times daily.      Donette Larry, CNM 10/13/2017, 7:20 PM

## 2017-10-13 NOTE — MAU Note (Signed)
Took a preg test a few days ago, was positive.  Wanting to know how far along she is .  Has been vomiting and having sharp pains in RLQ, having a lot of pressure.

## 2017-10-14 LAB — GC/CHLAMYDIA PROBE AMP (~~LOC~~) NOT AT ARMC
CHLAMYDIA, DNA PROBE: NEGATIVE
Neisseria Gonorrhea: NEGATIVE

## 2017-11-19 IMAGING — US US OB COMP LESS 14 WK
1 series · 15 of 28 positions shown · non-contrast
Comparison: None.

CLINICAL DATA: Low pelvic pain for 1 week with positive pregnancy
test.

EXAM:
OBSTETRIC <14 WK US AND TRANSVAGINAL OB US
TECHNIQUE: Both transabdominal and transvaginal ultrasound examinations were
performed for complete evaluation of the gestation as well as the
maternal uterus, adnexal regions, and pelvic cul-de-sac.
Transvaginal technique was performed to assess early pregnancy.

[Series 1: us ob comp less 14 wk · 15 of 109 slices shown]
[im 1/109]
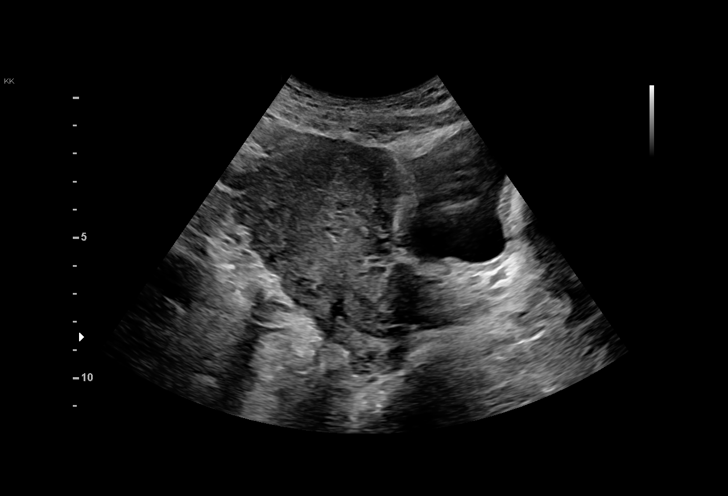
[im 9/109]
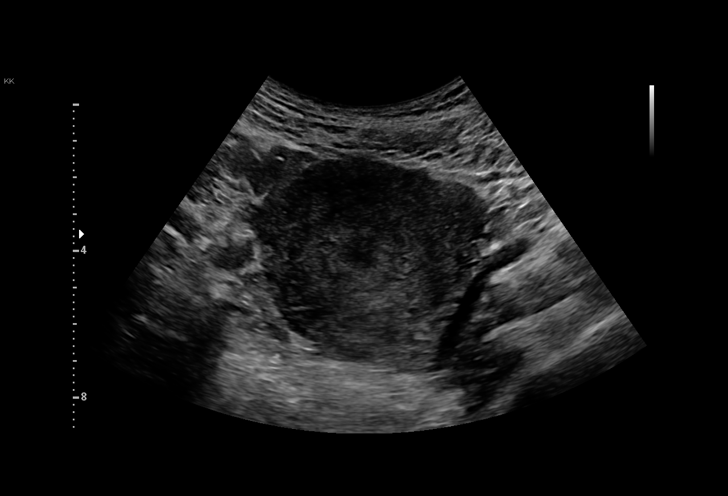
[im 17/109]
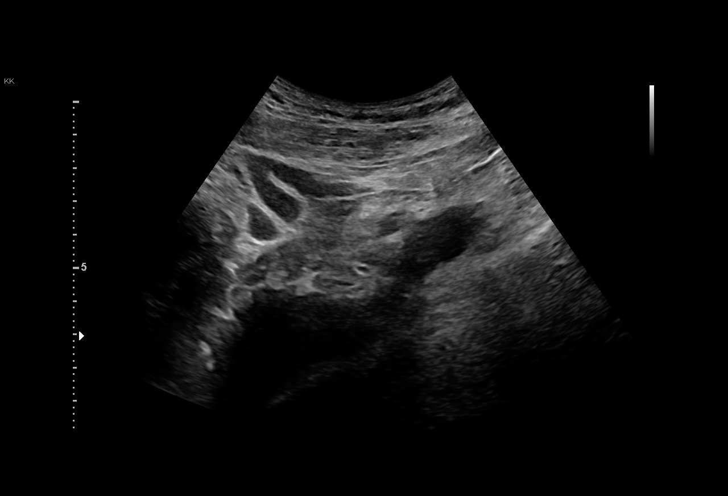
[im 25/109]
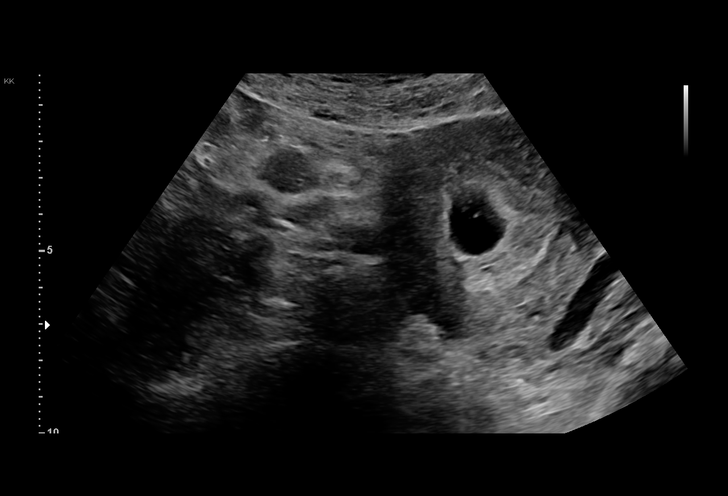
[im 33/109]
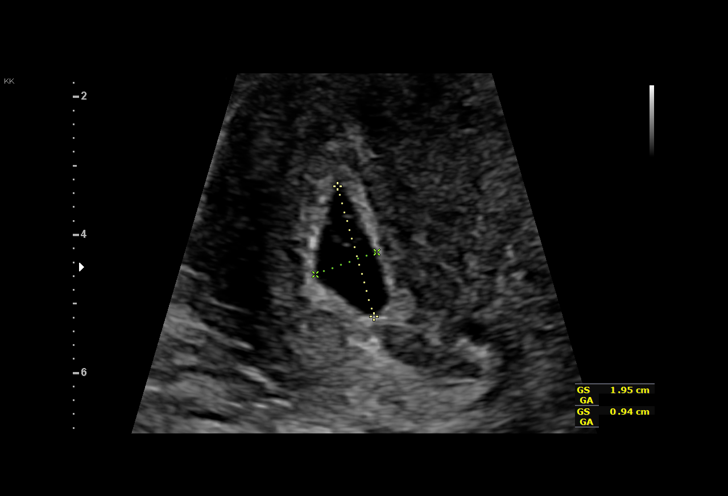
[im 41/109]
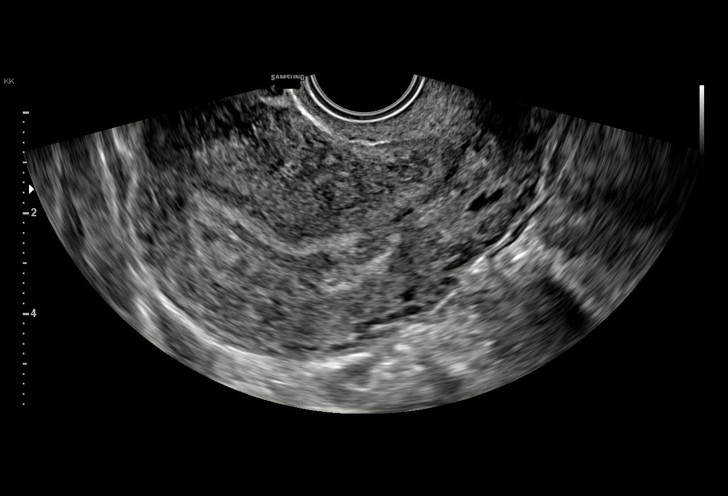
[im 49/109]
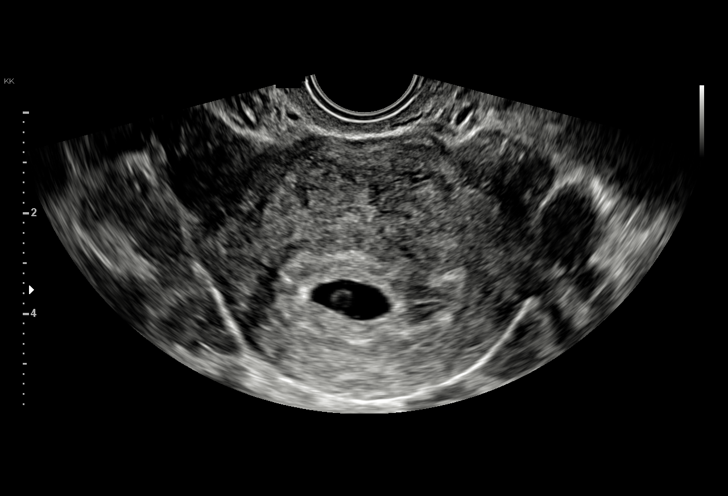
[im 57/109]
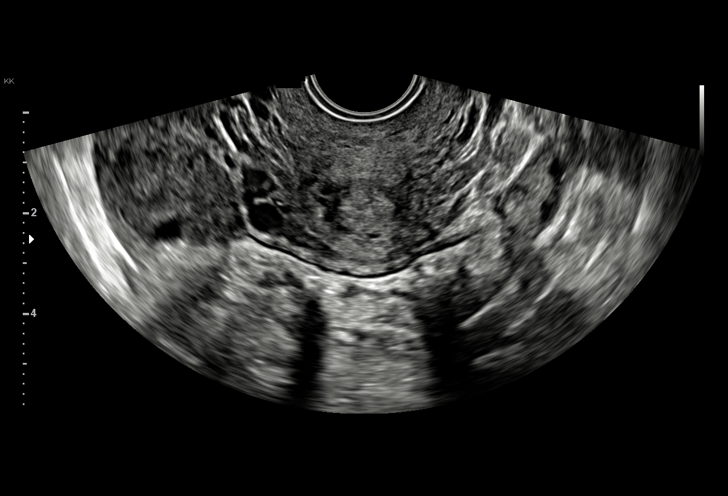
[im 61/109]
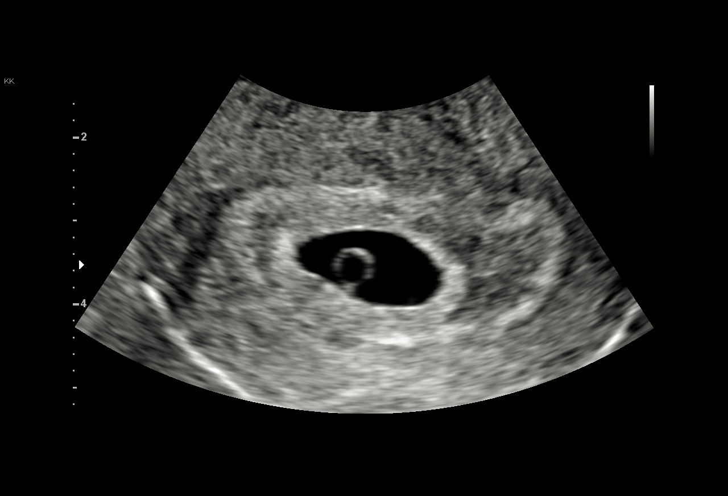
[im 69/109]
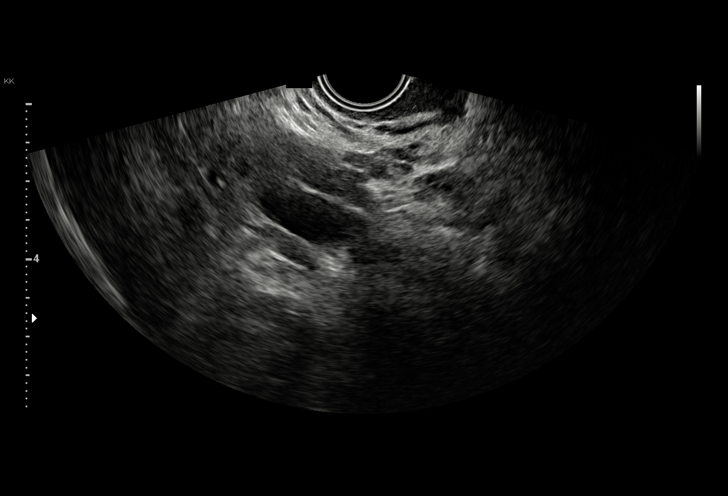
[im 77/109]
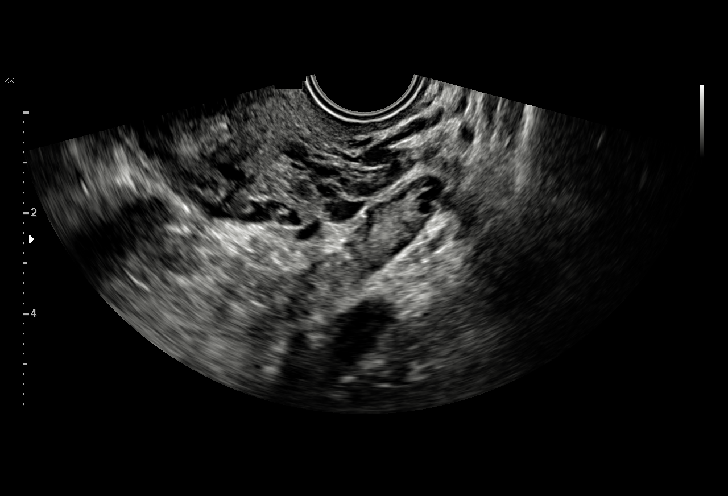
[im 85/109]
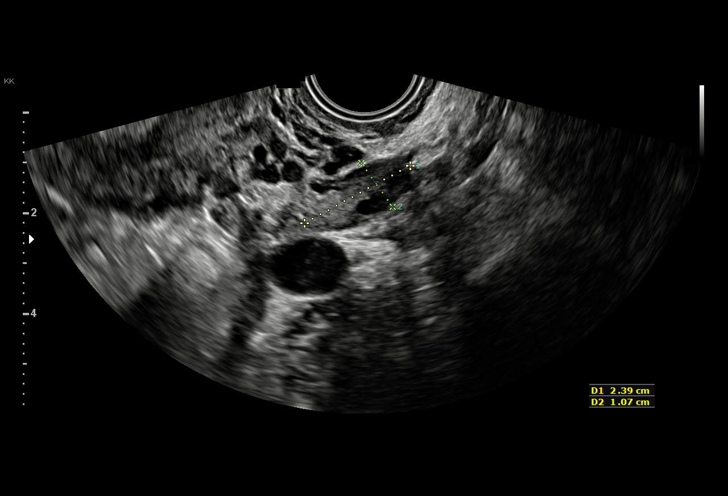
[im 93/109]
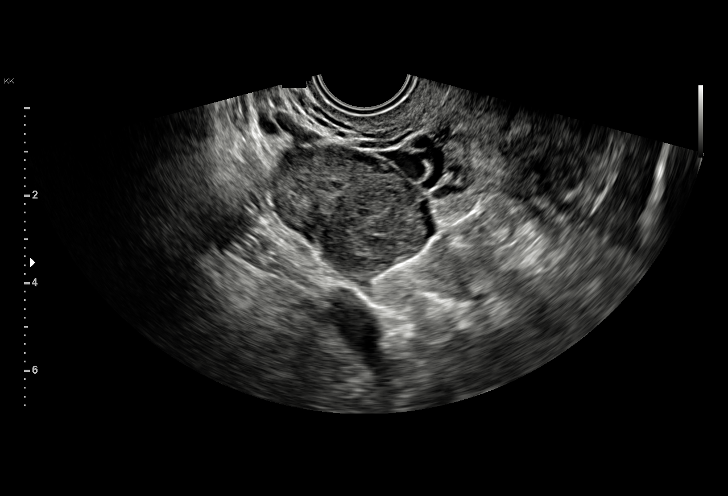
[im 101/109]
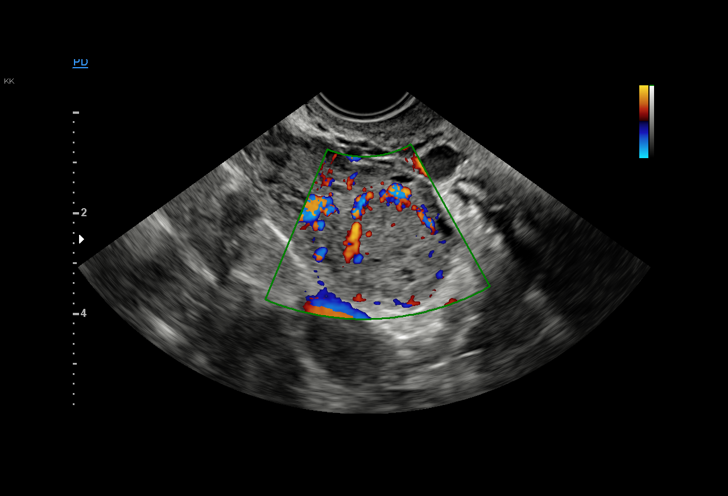
[im 109/109]
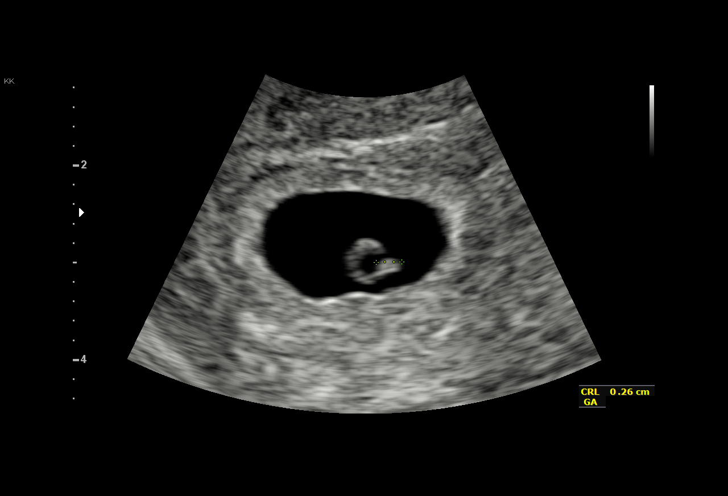

[15 of 28 positions shown; findings below may reference images not displayed]

FINDINGS: Intrauterine gestational sac: Single.

Yolk sac:  Visualized.

Embryo:  Visualized.

Cardiac Activity: Visualized.

Heart Rate: 155  bpm

CRL:  2.3  mm   5 w   5 d                  US EDC: 06/10/2018

Subchorionic hemorrhage:  None visualized.

Maternal uterus/adnexae: Unremarkable
IMPRESSION: Single living intrauterine gestation at estimated 5 week 5 day
gestational age by crown-rump length.

## 2017-11-26 ENCOUNTER — Ambulatory Visit (INDEPENDENT_AMBULATORY_CARE_PROVIDER_SITE_OTHER): Payer: Medicaid Other | Admitting: Obstetrics and Gynecology

## 2017-11-26 ENCOUNTER — Other Ambulatory Visit (HOSPITAL_COMMUNITY)
Admission: RE | Admit: 2017-11-26 | Discharge: 2017-11-26 | Disposition: A | Discharge status: Home / Self Care | Payer: Medicaid Other | Source: Ambulatory Visit | Attending: Obstetrics and Gynecology | Admitting: Obstetrics and Gynecology

## 2017-11-26 ENCOUNTER — Encounter: Payer: Self-pay | Admitting: Obstetrics and Gynecology

## 2017-11-26 DIAGNOSIS — Z3480 Encounter for supervision of other normal pregnancy, unspecified trimester: Secondary | ICD-10-CM | POA: Insufficient documentation

## 2017-11-26 DIAGNOSIS — O09299 Supervision of pregnancy with other poor reproductive or obstetric history, unspecified trimester: Secondary | ICD-10-CM

## 2017-11-26 DIAGNOSIS — O09291 Supervision of pregnancy with other poor reproductive or obstetric history, first trimester: Secondary | ICD-10-CM | POA: Diagnosis not present

## 2017-11-26 DIAGNOSIS — Z8632 Personal history of gestational diabetes: Secondary | ICD-10-CM

## 2017-11-26 DIAGNOSIS — Z8759 Personal history of other complications of pregnancy, childbirth and the puerperium: Secondary | ICD-10-CM

## 2017-11-26 HISTORY — DX: Personal history of gestational diabetes: Z86.32

## 2017-11-26 MED ORDER — PREPLUS 27-1 MG PO TABS: 1.0000 | ORAL_TABLET | Freq: Every day | ORAL | 13 refills | Status: DC

## 2017-11-26 NOTE — Progress Notes (Signed)
Patient is in the office for initial ob visit, this is a planned pregnancy, FOB is involved. Pt denies any pain today.

## 2017-11-26 NOTE — Addendum Note (Signed)
Addended by: Natale MilchSTALLING, Deane Wattenbarger D on: 11/26/2017 04:18 PM   Modules accepted: Orders

## 2017-11-26 NOTE — Progress Notes (Signed)
Subjective:  Tiffany Hoover is a 26 y.o. G3P1011 at 51w0dbeing seen today for her first OB visit. EDD confirmed by first trimester U/S. H/O GDM (diet control last pregnancy). H/O GHTN ( no medications during pregnancy, labor or postpartum).  She is currently monitored for the following issues for this high-risk pregnancy and has Supervision of other normal pregnancy, antepartum; History of gestational diabetes in prior pregnancy, currently pregnant; and History of gestational hypertension on their problem list.  Patient reports no complaints.  Contractions: Not present. Vag. Bleeding: None.   . Denies leaking of fluid.   The following portions of the patient's history were reviewed and updated as appropriate: allergies, current medications, past family history, past medical history, past social history, past surgical history and problem list. Problem list updated.  Objective:   Vitals:   11/26/17 1415  BP: 120/75  Pulse: 77  Weight: 188 lb (85.3 kg)    Fetal Status: Fetal Heart Rate (bpm): 160         General:  Alert, oriented and cooperative. Patient is in no acute distress.  Skin: Skin is warm and dry. No rash noted.   Cardiovascular: Normal heart rate noted  Respiratory: Normal respiratory effort, no problems with respiration noted  Abdomen: Soft, gravid, appropriate for gestational age. Pain/Pressure: Absent     Pelvic:  Cervical exam performed        Extremities: Normal range of motion.  Edema: Trace  Mental Status: Normal mood and affect. Normal behavior. Normal judgment and thought content.  Breast sym supple no nipple discharge, masses or adenopathy Urinalysis:      Assessment and Plan:  Pregnancy: G3P1011 at 163w0d1. Supervision of other normal pregnancy, antepartum Prenatal care and labs reviewed with pt. - Cytology - PAP - Cervicovaginal ancillary only - Hemoglobinopathy evaluation - Cystic Fibrosis Mutation 97 - Obstetric Panel, Including HIV - Culture, OB Urine -  Prenatal Vit-Fe Fumarate-FA (PREPLUS) 27-1 MG TABS; Take 1 tablet by mouth daily.  Dispense: 30 tablet; Refill: 13  2. History of gestational diabetes in prior pregnancy, currently pregnant  - Hemoglobin A1c  3. History of gestational hypertension  - TSH - Comp Met (CMET) - Protein / creatinine ratio, urine  Preterm labor symptoms and general obstetric precautions including but not limited to vaginal bleeding, contractions, leaking of fluid and fetal movement were reviewed in detail with the patient. Please refer to After Visit Summary for other counseling recommendations.  Return in about 4 weeks (around 12/24/2017) for OB visit.   ErChancy MilroyMD

## 2017-11-26 NOTE — Patient Instructions (Signed)
First Trimester of Pregnancy The first trimester of pregnancy is from week 1 until the end of week 13 (months 1 through 3). A week after a sperm fertilizes an egg, the egg will implant on the wall of the uterus. This embryo will begin to develop into a baby. Genes from you and your partner will form the baby. The female genes will determine whether the baby will be a boy or a girl. At 6-8 weeks, the eyes and face will be formed, and the heartbeat can be seen on ultrasound. At the end of 12 weeks, all the baby's organs will be formed. Now that you are pregnant, you will want to do everything you can to have a healthy baby. Two of the most important things are to get good prenatal care and to follow your health care provider's instructions. Prenatal care is all the medical care you receive before the baby's birth. This care will help prevent, find, and treat any problems during the pregnancy and childbirth. Body changes during your first trimester Your body goes through many changes during pregnancy. The changes vary from woman to woman.  You may gain or lose a couple of pounds at first.  You may feel sick to your stomach (nauseous) and you may throw up (vomit). If the vomiting is uncontrollable, call your health care provider.  You may tire easily.  You may develop headaches that can be relieved by medicines. All medicines should be approved by your health care provider.  You may urinate more often. Painful urination may mean you have a bladder infection.  You may develop heartburn as a result of your pregnancy.  You may develop constipation because certain hormones are causing the muscles that push stool through your intestines to slow down.  You may develop hemorrhoids or swollen veins (varicose veins).  Your breasts may begin to grow larger and become tender. Your nipples may stick out more, and the tissue that surrounds them (areola) may become darker.  Your gums may bleed and may be  sensitive to brushing and flossing.  Dark spots or blotches (chloasma, mask of pregnancy) may develop on your face. This will likely fade after the baby is born.  Your menstrual periods will stop.  You may have a loss of appetite.  You may develop cravings for certain kinds of food.  You may have changes in your emotions from day to day, such as being excited to be pregnant or being concerned that something may go wrong with the pregnancy and baby.  You may have more vivid and strange dreams.  You may have changes in your hair. These can include thickening of your hair, rapid growth, and changes in texture. Some women also have hair loss during or after pregnancy, or hair that feels dry or thin. Your hair will most likely return to normal after your baby is born.  What to expect at prenatal visits During a routine prenatal visit:  You will be weighed to make sure you and the baby are growing normally.  Your blood pressure will be taken.  Your abdomen will be measured to track your baby's growth.  The fetal heartbeat will be listened to between weeks 10 and 14 of your pregnancy.  Test results from any previous visits will be discussed.  Your health care provider may ask you:  How you are feeling.  If you are feeling the baby move.  If you have had any abnormal symptoms, such as leaking fluid, bleeding, severe headaches,   or abdominal cramping.  If you are using any tobacco products, including cigarettes, chewing tobacco, and electronic cigarettes.  If you have any questions.  Other tests that may be performed during your first trimester include:  Blood tests to find your blood type and to check for the presence of any previous infections. The tests will also be used to check for low iron levels (anemia) and protein on red blood cells (Rh antibodies). Depending on your risk factors, or if you previously had diabetes during pregnancy, you may have tests to check for high blood  sugar that affects pregnant women (gestational diabetes).  Urine tests to check for infections, diabetes, or protein in the urine.  An ultrasound to confirm the proper growth and development of the baby.  Fetal screens for spinal cord problems (spina bifida) and Down syndrome.  HIV (human immunodeficiency virus) testing. Routine prenatal testing includes screening for HIV, unless you choose not to have this test.  You may need other tests to make sure you and the baby are doing well.  Follow these instructions at home: Medicines  Follow your health care provider's instructions regarding medicine use. Specific medicines may be either safe or unsafe to take during pregnancy.  Take a prenatal vitamin that contains at least 600 micrograms (mcg) of folic acid.  If you develop constipation, try taking a stool softener if your health care provider approves. Eating and drinking  Eat a balanced diet that includes fresh fruits and vegetables, whole grains, good sources of protein such as meat, eggs, or tofu, and low-fat dairy. Your health care provider will help you determine the amount of weight gain that is right for you.  Avoid raw meat and uncooked cheese. These carry germs that can cause birth defects in the baby.  Eating four or five small meals rather than three large meals a day may help relieve nausea and vomiting. If you start to feel nauseous, eating a few soda crackers can be helpful. Drinking liquids between meals, instead of during meals, also seems to help ease nausea and vomiting.  Limit foods that are high in fat and processed sugars, such as fried and sweet foods.  To prevent constipation: ? Eat foods that are high in fiber, such as fresh fruits and vegetables, whole grains, and beans. ? Drink enough fluid to keep your urine clear or pale yellow. Activity  Exercise only as directed by your health care provider. Most women can continue their usual exercise routine during  pregnancy. Try to exercise for 30 minutes at least 5 days a week. Exercising will help you: ? Control your weight. ? Stay in shape. ? Be prepared for labor and delivery.  Experiencing pain or cramping in the lower abdomen or lower back is a good sign that you should stop exercising. Check with your health care provider before continuing with normal exercises.  Try to avoid standing for long periods of time. Move your legs often if you must stand in one place for a long time.  Avoid heavy lifting.  Wear low-heeled shoes and practice good posture.  You may continue to have sex unless your health care provider tells you not to. Relieving pain and discomfort  Wear a good support bra to relieve breast tenderness.  Take warm sitz baths to soothe any pain or discomfort caused by hemorrhoids. Use hemorrhoid cream if your health care provider approves.  Rest with your legs elevated if you have leg cramps or low back pain.  If you develop   varicose veins in your legs, wear support hose. Elevate your feet for 15 minutes, 3-4 times a day. Limit salt in your diet. Prenatal care  Schedule your prenatal visits by the twelfth week of pregnancy. They are usually scheduled monthly at first, then more often in the last 2 months before delivery.  Write down your questions. Take them to your prenatal visits.  Keep all your prenatal visits as told by your health care provider. This is important. Safety  Wear your seat belt at all times when driving.  Make a list of emergency phone numbers, including numbers for family, friends, the hospital, and police and fire departments. General instructions  Ask your health care provider for a referral to a local prenatal education class. Begin classes no later than the beginning of month 6 of your pregnancy.  Ask for help if you have counseling or nutritional needs during pregnancy. Your health care provider can offer advice or refer you to specialists for help  with various needs.  Do not use hot tubs, steam rooms, or saunas.  Do not douche or use tampons or scented sanitary pads.  Do not cross your legs for long periods of time.  Avoid cat litter boxes and soil used by cats. These carry germs that can cause birth defects in the baby and possibly loss of the fetus by miscarriage or stillbirth.  Avoid all smoking, herbs, alcohol, and medicines not prescribed by your health care provider. Chemicals in these products affect the formation and growth of the baby.  Do not use any products that contain nicotine or tobacco, such as cigarettes and e-cigarettes. If you need help quitting, ask your health care provider. You may receive counseling support and other resources to help you quit.  Schedule a dentist appointment. At home, brush your teeth with a soft toothbrush and be gentle when you floss. Contact a health care provider if:  You have dizziness.  You have mild pelvic cramps, pelvic pressure, or nagging pain in the abdominal area.  You have persistent nausea, vomiting, or diarrhea.  You have a bad smelling vaginal discharge.  You have pain when you urinate.  You notice increased swelling in your face, hands, legs, or ankles.  You are exposed to fifth disease or chickenpox.  You are exposed to German measles (rubella) and have never had it. Get help right away if:  You have a fever.  You are leaking fluid from your vagina.  You have spotting or bleeding from your vagina.  You have severe abdominal cramping or pain.  You have rapid weight gain or loss.  You vomit blood or material that looks like coffee grounds.  You develop a severe headache.  You have shortness of breath.  You have any kind of trauma, such as from a fall or a car accident. Summary  The first trimester of pregnancy is from week 1 until the end of week 13 (months 1 through 3).  Your body goes through many changes during pregnancy. The changes vary from  woman to woman.  You will have routine prenatal visits. During those visits, your health care provider will examine you, discuss any test results you may have, and talk with you about how you are feeling. This information is not intended to replace advice given to you by your health care provider. Make sure you discuss any questions you have with your health care provider. Document Released: 11/26/2001 Document Revised: 11/13/2016 Document Reviewed: 11/13/2016 Elsevier Interactive Patient Education  2017 Elsevier   Inc[employer]  

## 2017-11-27 LAB — PROTEIN / CREATININE RATIO, URINE
Creatinine, Urine: 148.1 mg/dL
Protein, Ur: 10.1 mg/dL
Protein/Creat Ratio: 68 mg/g creat (ref 0–200)

## 2017-11-28 LAB — CYTOLOGY - PAP
Diagnosis: NEGATIVE
HPV (WINDOPATH): NOT DETECTED

## 2017-11-28 LAB — URINE CULTURE, OB REFLEX

## 2017-11-28 LAB — CULTURE, OB URINE

## 2017-11-29 LAB — COMPREHENSIVE METABOLIC PANEL
ALT: 12 IU/L (ref 0–32)
AST: 18 IU/L (ref 0–40)
Albumin/Globulin Ratio: 1.5 (ref 1.2–2.2)
Albumin: 4 g/dL (ref 3.5–5.5)
Alkaline Phosphatase: 46 IU/L (ref 39–117)
BUN / CREAT RATIO: 14 (ref 9–23)
BUN: 10 mg/dL (ref 6–20)
Bilirubin Total: <0.2 mg/dL (ref 0.0–1.2)
CO2: 22 mmol/L (ref 20–29)
CREATININE: 0.69 mg/dL (ref 0.57–1.00)
Calcium: 9.7 mg/dL (ref 8.7–10.2)
Chloride: 101 mmol/L (ref 96–106)
GFR calc Af Amer: 139 mL/min/{1.73_m2} (ref >59)
GFR calc non Af Amer: 121 mL/min/{1.73_m2} (ref >59)
Globulin, Total: 2.6 g/dL (ref 1.5–4.5)
Glucose: 76 mg/dL (ref 65–99)
Potassium: 4 mmol/L (ref 3.5–5.2)
Sodium: 137 mmol/L (ref 134–144)
Total Protein: 6.6 g/dL (ref 6.0–8.5)

## 2017-11-29 LAB — OBSTETRIC PANEL, INCLUDING HIV
Antibody Screen: NEGATIVE
Basophils Absolute: 0 10*3/uL (ref 0.0–0.2)
Basos: 0 %
EOS (ABSOLUTE): 0.1 10*3/uL (ref 0.0–0.4)
Eos: 1 %
HIV SCREEN 4TH GENERATION: NONREACTIVE
Hematocrit: 36.3 % (ref 34.0–46.6)
Hemoglobin: 12.2 g/dL (ref 11.1–15.9)
Hepatitis B Surface Ag: NEGATIVE
Immature Grans (Abs): 0 10*3/uL (ref 0.0–0.1)
Immature Granulocytes: 0 %
LYMPHS ABS: 2.1 10*3/uL (ref 0.7–3.1)
Lymphs: 22 %
MCH: 29.3 pg (ref 26.6–33.0)
MCHC: 33.6 g/dL (ref 31.5–35.7)
MCV: 87 fL (ref 79–97)
MONOS ABS: 0.5 10*3/uL (ref 0.1–0.9)
Monocytes: 5 %
NEUTROS ABS: 7.1 10*3/uL — AB (ref 1.4–7.0)
NEUTROS PCT: 72 %
PLATELETS: 299 10*3/uL (ref 150–379)
RBC: 4.16 x10E6/uL (ref 3.77–5.28)
RDW: 15.5 % — AB (ref 12.3–15.4)
RPR Ser Ql: NONREACTIVE
Rh Factor: POSITIVE
Rubella Antibodies, IGG: 2.47 index (ref >0.99)
WBC: 10 10*3/uL (ref 3.4–10.8)

## 2017-11-29 LAB — HEMOGLOBINOPATHY EVALUATION
HEMOGLOBIN A2 QUANTITATION: 2.1 % (ref 1.8–3.2)
HGB C: 0 %
HGB S: 0 %
HGB VARIANT: 0 %
Hemoglobin F Quantitation: 0 % (ref 0.0–2.0)
Hgb A: 97.9 % (ref 96.4–98.8)

## 2017-11-29 LAB — HEMOGLOBIN A1C
ESTIMATED AVERAGE GLUCOSE: 117 mg/dL
HEMOGLOBIN A1C: 5.7 % — AB (ref 4.8–5.6)

## 2017-11-29 LAB — TSH: TSH: 1.7 u[IU]/mL (ref 0.450–4.500)

## 2017-12-03 LAB — CYSTIC FIBROSIS MUTATION 97: Interpretation: NOT DETECTED

## 2017-12-16 NOTE — L&D Delivery Note (Signed)
Patient: Tiffany Hoover MRN: [id number]  GBS status: negative  Patient is a 27 y.o. now [id number] s/p NSVD at 8858w0d, who was admitted for IOL for new onset pre-eclampsia. Her pregnancy was complicated by diet-controlled GDM, and BMI of 39. S/p IOL w/ cytotec x1 followed by IV Pitocin.  Delivery Note At 1:54 AM a viable female was delivered via  SVD Presentation: vertex;  LOA.  APGAR: 5, 8; weight 9lb 3oz.   Placenta status: separated at cord insertion site, but all pieces present.   Cord: 3-vessel. Cord pH: sent Complications: shoulder dystocia (45-seconds)  Anesthesia:  Epidural Episiotomy:  None Lacerations:  None Suture Repair: none Est. Blood Loss (mL):  250  Patient complete and pushing. With good maternal effort head crowning, but turtle sign noted. She was instructed on controlled pushes while extra RNs were called into room for anticipation of shoulder dystocia. Pt was placed on McRobert's.  Head delivered LOA. No nuchal cord present. Shoulder were noted not to come easily. I immediately attempted delivery of posterior arm; not able to deliver posterior, but was able grab axilla and help shoulders rotate some. Suprapubic pressure then done, then shoulders were delivered, followed by body. Head to body delivery time was 45 seconds. Infant with poor tone, so umbilical cord was clamped and cut w/o delay, and infant passed to the warmed under care of neonatal team. IV Pitocin started. Gentle cord traction applied to help with delivery of placenta, which was coming easily, then trickle of blood noted, and cord felt to be avulsing. Manual exploration found placenta to be in lower uterine segment but did not feel intact; this was manually extracted. No retained products felt on manual exploration. Placenta inspected and found to have been torn half way along site of cord insertion, but all cotyledons present. No perineal or vaginal lacerations. Misoprostol 1,000 mcg given prophylactically.  Mom to  postpartum.  Baby to Couplet care / Skin to Skin.  Raynelle FanningJulie P. Londin Antone, MD OB Fellow 06/10/18, 2:28 AM

## 2017-12-24 ENCOUNTER — Ambulatory Visit (INDEPENDENT_AMBULATORY_CARE_PROVIDER_SITE_OTHER): Payer: Medicaid Other | Admitting: Obstetrics and Gynecology

## 2017-12-24 ENCOUNTER — Encounter: Payer: Self-pay | Admitting: Obstetrics and Gynecology

## 2017-12-24 ENCOUNTER — Other Ambulatory Visit (HOSPITAL_COMMUNITY)
Admission: RE | Admit: 2017-12-24 | Discharge: 2017-12-24 | Disposition: A | Discharge status: Home / Self Care | Payer: Medicaid Other | Source: Ambulatory Visit | Attending: Obstetrics and Gynecology | Admitting: Obstetrics and Gynecology

## 2017-12-24 VITALS — BP 131/82 | HR 96 | Wt 188.0 lb

## 2017-12-24 DIAGNOSIS — Z8632 Personal history of gestational diabetes: Secondary | ICD-10-CM

## 2017-12-24 DIAGNOSIS — Z3480 Encounter for supervision of other normal pregnancy, unspecified trimester: Secondary | ICD-10-CM

## 2017-12-24 DIAGNOSIS — Z8759 Personal history of other complications of pregnancy, childbirth and the puerperium: Secondary | ICD-10-CM

## 2017-12-24 DIAGNOSIS — N898 Other specified noninflammatory disorders of vagina: Secondary | ICD-10-CM | POA: Insufficient documentation

## 2017-12-24 DIAGNOSIS — O26892 Other specified pregnancy related conditions, second trimester: Secondary | ICD-10-CM | POA: Insufficient documentation

## 2017-12-24 DIAGNOSIS — O26899 Other specified pregnancy related conditions, unspecified trimester: Secondary | ICD-10-CM

## 2017-12-24 DIAGNOSIS — Z3A16 16 weeks gestation of pregnancy: Secondary | ICD-10-CM | POA: Diagnosis not present

## 2017-12-24 DIAGNOSIS — O09299 Supervision of pregnancy with other poor reproductive or obstetric history, unspecified trimester: Secondary | ICD-10-CM

## 2017-12-24 DIAGNOSIS — O09292 Supervision of pregnancy with other poor reproductive or obstetric history, second trimester: Secondary | ICD-10-CM

## 2017-12-24 NOTE — Progress Notes (Signed)
Subjective:  Tiffany Hoover is a 27 y.o. G3P1011 at 2062w0d being seen today for ongoing prenatal care.  She is currently monitored for the following issues for this high-risk pregnancy and has Supervision of other normal pregnancy, antepartum; History of gestational diabetes in prior pregnancy, currently pregnant; and History of gestational hypertension on their problem list.  Patient reports vaginal discharge.  Contractions: Not present. Vag. Bleeding: None.   . Denies leaking of fluid.   The following portions of the patient's history were reviewed and updated as appropriate: allergies, current medications, past family history, past medical history, past social history, past surgical history and problem list. Problem list updated.  Objective:   Vitals:   12/24/17 1440  BP: 131/82  Pulse: 96  Weight: 188 lb (85.3 kg)    Fetal Status: Fetal Heart Rate (bpm): 144         General:  Alert, oriented and cooperative. Patient is in no acute distress.  Skin: Skin is warm and dry. No rash noted.   Cardiovascular: Normal heart rate noted  Respiratory: Normal respiratory effort, no problems with respiration noted  Abdomen: Soft, gravid, appropriate for gestational age. Pain/Pressure: Present     Pelvic:  Cervical exam deferred        Extremities: Normal range of motion.  Edema: None  Mental Status: Normal mood and affect. Normal behavior. Normal judgment and thought content.   Urinalysis:      Assessment and Plan:  Pregnancy: G3P1011 at 4762w0d  1. Supervision of other normal pregnancy, antepartum Quad screen today Self swab for vaginal discharge - US MFM OB COMP + 14 WK; Future  2. History of gestational diabetes in prior pregnancy, currently pregnant HgbA1C 5.7, will check 1 hr glucola next week  3. History of gestational hypertension BP stable  Preterm labor symptoms and general obstetric precautions including but not limited to vaginal bleeding, contractions, leaking of fluid and  fetal movement were reviewed in detail with the patient. Please refer to After Visit Summary for other counseling recommendations.  Return in about 4 weeks (around 01/21/2018).   Hermina StaggersErvin, Fredia Chittenden L, MD

## 2017-12-24 NOTE — Progress Notes (Signed)
ROB c/o: low pressure at times not consistent and vaginal discharge denies any odor or itching.

## 2017-12-24 NOTE — Patient Instructions (Signed)

## 2017-12-25 LAB — CERVICOVAGINAL ANCILLARY ONLY
BACTERIAL VAGINITIS: NEGATIVE
Candida vaginitis: NEGATIVE
Chlamydia: NEGATIVE
Neisseria Gonorrhea: NEGATIVE
Trichomonas: NEGATIVE

## 2017-12-28 LAB — AFP TETRA
DIA MOM VALUE: 0.92
DIA Value (EIA): 139.56 pg/mL
DSR (By Age)    1 IN: 954
DSR (Second Trimester) 1 IN: 4693
Gestational Age: 16 WEEKS
MATERNAL AGE AT EDD: 26.5 a
MSAFP Mom: 0.56
MSAFP: 17.1 ng/mL
MSHCG Mom: 0.8
MSHCG: 27357 m[IU]/mL
OSB RISK: 10000
T18 (By Age): 1:3718 {titer}
TEST RESULTS AFP: NEGATIVE
WEIGHT: 188 [lb_av]
uE3 Mom: 1
uE3 Value: 0.75 ng/mL

## 2018-01-07 ENCOUNTER — Encounter (HOSPITAL_COMMUNITY): Payer: Self-pay | Admitting: Obstetrics and Gynecology

## 2018-01-14 ENCOUNTER — Other Ambulatory Visit: Payer: Self-pay | Admitting: Obstetrics and Gynecology

## 2018-01-14 ENCOUNTER — Ambulatory Visit (HOSPITAL_COMMUNITY)
Admission: RE | Admit: 2018-01-14 | Discharge: 2018-01-14 | Disposition: A | Discharge status: Home / Self Care | Payer: Medicaid Other | Source: Ambulatory Visit | Attending: Obstetrics and Gynecology | Admitting: Obstetrics and Gynecology

## 2018-01-14 ENCOUNTER — Encounter (HOSPITAL_COMMUNITY): Payer: Self-pay

## 2018-01-14 DIAGNOSIS — Z8632 Personal history of gestational diabetes: Secondary | ICD-10-CM | POA: Diagnosis not present

## 2018-01-14 DIAGNOSIS — O09299 Supervision of pregnancy with other poor reproductive or obstetric history, unspecified trimester: Secondary | ICD-10-CM

## 2018-01-14 DIAGNOSIS — Z3689 Encounter for other specified antenatal screening: Secondary | ICD-10-CM | POA: Insufficient documentation

## 2018-01-14 DIAGNOSIS — Z3480 Encounter for supervision of other normal pregnancy, unspecified trimester: Secondary | ICD-10-CM

## 2018-01-14 DIAGNOSIS — Z3A19 19 weeks gestation of pregnancy: Secondary | ICD-10-CM | POA: Insufficient documentation

## 2018-01-14 DIAGNOSIS — O09292 Supervision of pregnancy with other poor reproductive or obstetric history, second trimester: Secondary | ICD-10-CM | POA: Diagnosis not present

## 2018-01-15 ENCOUNTER — Other Ambulatory Visit (HOSPITAL_COMMUNITY): Payer: Self-pay | Admitting: *Deleted

## 2018-01-15 DIAGNOSIS — Z8759 Personal history of other complications of pregnancy, childbirth and the puerperium: Secondary | ICD-10-CM

## 2018-01-21 ENCOUNTER — Other Ambulatory Visit: Payer: Medicaid Other

## 2018-01-21 ENCOUNTER — Ambulatory Visit (INDEPENDENT_AMBULATORY_CARE_PROVIDER_SITE_OTHER): Payer: Medicaid Other | Admitting: Obstetrics & Gynecology

## 2018-01-21 VITALS — BP 123/79 | HR 89 | Wt 195.6 lb

## 2018-01-21 DIAGNOSIS — O09299 Supervision of pregnancy with other poor reproductive or obstetric history, unspecified trimester: Secondary | ICD-10-CM

## 2018-01-21 DIAGNOSIS — Z3480 Encounter for supervision of other normal pregnancy, unspecified trimester: Secondary | ICD-10-CM

## 2018-01-21 DIAGNOSIS — Z8632 Personal history of gestational diabetes: Secondary | ICD-10-CM

## 2018-01-21 NOTE — Progress Notes (Signed)
    PRENATAL VISIT NOTE  Subjective:  Tiffany Hoover is a 27 y.o. G3P1011 at 801w0d being seen today for ongoing prenatal care.  She is currently monitored for the following issues for this low-risk pregnancy and has Supervision of other normal pregnancy, antepartum; History of gestational diabetes in prior pregnancy, currently pregnant; and History of gestational hypertension on their problem list.  Patient reports no complaints.  Contractions: Not present. Vag. Bleeding: None.  Movement: Present. Denies leaking of fluid.   The following portions of the patient's history were reviewed and updated as appropriate: allergies, current medications, past family history, past medical history, past social history, past surgical history and problem list. Problem list updated.  Objective:   Vitals:   01/21/18 0837  BP: 123/79  Pulse: 89  Weight: 195 lb 9.6 oz (88.7 kg)    Fetal Status: Fetal Heart Rate (bpm): 142 Fundal Height: 32 cm Movement: Present     General:  Alert, oriented and cooperative. Patient is in no acute distress.  Skin: Skin is warm and dry. No rash noted.   Cardiovascular: Normal heart rate noted  Respiratory: Normal respiratory effort, no problems with respiration noted  Abdomen: Soft, gravid, appropriate for gestational age.  Pain/Pressure: Absent     Pelvic: Cervical exam deferred        Extremities: Normal range of motion.  Edema: None  Mental Status:  Normal mood and affect. Normal behavior. Normal judgment and thought content.   Assessment and Plan:  Pregnancy: G3P1011 at 611w0d  1. Supervision of other normal pregnancy, antepartum   2. History of gestational diabetes in prior pregnancy, currently pregnant  - Glucose Tolerance, 2 Hours w/1 Hour  Preterm labor symptoms and general obstetric precautions including but not limited to vaginal bleeding, contractions, leaking of fluid and fetal movement were reviewed in detail with the patient. Please refer to After  Visit Summary for other counseling recommendations.  Return in about 4 weeks (around 02/18/2018).   Scheryl DarterJames Jashay Roddy, MD

## 2018-01-21 NOTE — Patient Instructions (Signed)
Second Trimester of Pregnancy The second trimester is from week 13 through week 28, month 4 through 6. This is often the time in pregnancy that you feel your best. Often times, morning sickness has lessened or quit. You may have more energy, and you may get hungry more often. Your unborn baby (fetus) is growing rapidly. At the end of the sixth month, he or she is about 9 inches long and weighs about 1 pounds. You will likely feel the baby move (quickening) between 18 and 20 weeks of pregnancy. Follow these instructions at home:  Avoid all smoking, herbs, and alcohol. Avoid drugs not approved by your doctor.  Do not use any tobacco products, including cigarettes, chewing tobacco, and electronic cigarettes. If you need help quitting, ask your doctor. You may get counseling or other support to help you quit.  Only take medicine as told by your doctor. Some medicines are safe and some are not during pregnancy.  Exercise only as told by your doctor. Stop exercising if you start having cramps.  Eat regular, healthy meals.  Wear a good support bra if your breasts are tender.  Do not use hot tubs, steam rooms, or saunas.  Wear your seat belt when driving.  Avoid raw meat, uncooked cheese, and liter boxes and soil used by cats.  Take your prenatal vitamins.  Take 1500-2000 milligrams of calcium daily starting at the 20th week of pregnancy until you deliver your baby.  Try taking medicine that helps you poop (stool softener) as needed, and if your doctor approves. Eat more fiber by eating fresh fruit, vegetables, and whole grains. Drink enough fluids to keep your pee (urine) clear or pale yellow.  Take warm water baths (sitz baths) to soothe pain or discomfort caused by hemorrhoids. Use hemorrhoid cream if your doctor approves.  If you have puffy, bulging veins (varicose veins), wear support hose. Raise (elevate) your feet for 15 minutes, 3-4 times a day. Limit salt in your diet.  Avoid heavy  lifting, wear low heals, and sit up straight.  Rest with your legs raised if you have leg cramps or low back pain.  Visit your dentist if you have not gone during your pregnancy. Use a soft toothbrush to brush your teeth. Be gentle when you floss.  You can have sex (intercourse) unless your doctor tells you not to.  Go to your doctor visits. Get help if:  You feel dizzy.  You have mild cramps or pressure in your lower belly (abdomen).  You have a nagging pain in your belly area.  You continue to feel sick to your stomach (nauseous), throw up (vomit), or have watery poop (diarrhea).  You have bad smelling fluid coming from your vagina.  You have pain with peeing (urination). Get help right away if:  You have a fever.  You are leaking fluid from your vagina.  You have spotting or bleeding from your vagina.  You have severe belly cramping or pain.  You lose or gain weight rapidly.  You have trouble catching your breath and have chest pain.  You notice sudden or extreme puffiness (swelling) of your face, hands, ankles, feet, or legs.  You have not felt the baby move in over an hour.  You have severe headaches that do not go away with medicine.  You have vision changes. This information is not intended to replace advice given to you by your health care provider. Make sure you discuss any questions you have with your health care   provider. Document Released: 02/26/2010 Document Revised: 05/09/2016 Document Reviewed: 02/02/2013 Elsevier Interactive Patient Education  2017 [employer]vier Inc.  

## 2018-01-22 LAB — GLUCOSE TOLERANCE, 2 HOURS W/ 1HR
GLUCOSE, 2 HOUR: 101 mg/dL (ref 65–152)
GLUCOSE, FASTING: 93 mg/dL — AB (ref 65–91)
Glucose, 1 hour: 159 mg/dL (ref 65–179)

## 2018-02-18 ENCOUNTER — Encounter: Payer: Medicaid Other | Admitting: Obstetrics and Gynecology

## 2018-02-25 ENCOUNTER — Encounter: Payer: Self-pay | Admitting: Obstetrics and Gynecology

## 2018-02-25 ENCOUNTER — Ambulatory Visit (INDEPENDENT_AMBULATORY_CARE_PROVIDER_SITE_OTHER): Payer: Medicaid Other | Admitting: Obstetrics and Gynecology

## 2018-02-25 ENCOUNTER — Ambulatory Visit (HOSPITAL_COMMUNITY): Payer: Medicaid Other

## 2018-02-25 VITALS — BP 123/73 | HR 80 | Wt 200.7 lb

## 2018-02-25 DIAGNOSIS — O24410 Gestational diabetes mellitus in pregnancy, diet controlled: Secondary | ICD-10-CM

## 2018-02-25 DIAGNOSIS — Z3480 Encounter for supervision of other normal pregnancy, unspecified trimester: Secondary | ICD-10-CM

## 2018-02-25 DIAGNOSIS — O24419 Gestational diabetes mellitus in pregnancy, unspecified control: Secondary | ICD-10-CM | POA: Insufficient documentation

## 2018-02-25 MED ORDER — ASPIRIN EC 81 MG PO TBEC: 81.0000 mg | DELAYED_RELEASE_TABLET | Freq: Every day | ORAL | 2 refills | Status: DC

## 2018-02-25 NOTE — Progress Notes (Signed)
Subjective:  Tiffany Hoover is a 27 y.o. G3P1011 at 372w0d being seen today for ongoing prenatal care.  She is currently monitored for the following issues for this high-risk pregnancy and has Supervision of other normal pregnancy, antepartum; History of gestational diabetes in prior pregnancy, currently pregnant; History of gestational hypertension; and Gestational diabetes on their problem list.  Patient reports no complaints.  Contractions: Not present. Vag. Bleeding: None.  Movement: Present. Denies leaking of fluid.   The following portions of the patient's history were reviewed and updated as appropriate: allergies, current medications, past family history, past medical history, past social history, past surgical history and problem list. Problem list updated.  Objective:   Vitals:   02/25/18 1456  BP: 123/73  Pulse: 80  Weight: 200 lb 11.2 oz (91 kg)    Fetal Status: Fetal Heart Rate (bpm): 150   Movement: Present     General:  Alert, oriented and cooperative. Patient is in no acute distress.  Skin: Skin is warm and dry. No rash noted.   Cardiovascular: Normal heart rate noted  Respiratory: Normal respiratory effort, no problems with respiration noted  Abdomen: Soft, gravid, appropriate for gestational age. Pain/Pressure: Absent     Pelvic:  Cervical exam deferred        Extremities: Normal range of motion.  Edema: None  Mental Status: Normal mood and affect. Normal behavior. Normal judgment and thought content.   Urinalysis:      Assessment and Plan:  Pregnancy: G3P1011 at 6372w0d  1. Supervision of other normal pregnancy, antepartum Stable - Hemoglobin A1c - TSH - Comprehensive metabolic panel - Protein / creatinine ratio, urine  2. Diet controlled gestational diabetes mellitus (GDM) in second trimester Glucola last visit fasting 93 Pt with H/O GDM diet controlled with past pregnancy Reviewed with pt Will start CBG testing, refer to DM educator - Hemoglobin A1c -  TSH - Comprehensive metabolic panel - Protein / creatinine ratio, urine  Preterm labor symptoms and general obstetric precautions including but not limited to vaginal bleeding, contractions, leaking of fluid and fetal movement were reviewed in detail with the patient. Please refer to After Visit Summary for other counseling recommendations.  Return in about 3 weeks (around 03/18/2018) for OB visit.   Hermina StaggersErvin, Michael L, MD

## 2018-02-26 LAB — COMPREHENSIVE METABOLIC PANEL
A/G RATIO: 1.1 — AB (ref 1.2–2.2)
ALBUMIN: 3.4 g/dL — AB (ref 3.5–5.5)
ALT: 11 IU/L (ref 0–32)
AST: 16 IU/L (ref 0–40)
Alkaline Phosphatase: 57 IU/L (ref 39–117)
BUN / CREAT RATIO: 11 (ref 9–23)
BUN: 6 mg/dL (ref 6–20)
Bilirubin Total: <0.2 mg/dL (ref 0.0–1.2)
CALCIUM: 9.4 mg/dL (ref 8.7–10.2)
CHLORIDE: 104 mmol/L (ref 96–106)
CO2: 21 mmol/L (ref 20–29)
Creatinine, Ser: 0.54 mg/dL — ABNORMAL LOW (ref 0.57–1.00)
GFR, EST AFRICAN AMERICAN: 151 mL/min/{1.73_m2} (ref >59)
GFR, EST NON AFRICAN AMERICAN: 131 mL/min/{1.73_m2} (ref >59)
Globulin, Total: 3 g/dL (ref 1.5–4.5)
Glucose: 95 mg/dL (ref 65–99)
POTASSIUM: 3.9 mmol/L (ref 3.5–5.2)
Sodium: 138 mmol/L (ref 134–144)
TOTAL PROTEIN: 6.4 g/dL (ref 6.0–8.5)

## 2018-02-26 LAB — HEMOGLOBIN A1C
Est. average glucose Bld gHb Est-mCnc: 120 mg/dL
Hgb A1c MFr Bld: 5.8 % — ABNORMAL HIGH (ref 4.8–5.6)

## 2018-02-26 LAB — PROTEIN / CREATININE RATIO, URINE
Creatinine, Urine: 166.1 mg/dL
PROTEIN UR: 15 mg/dL
Protein/Creat Ratio: 90 mg/g creat (ref 0–200)

## 2018-02-26 LAB — TSH: TSH: 1.68 u[IU]/mL (ref 0.450–4.500)

## 2018-03-02 ENCOUNTER — Other Ambulatory Visit (HOSPITAL_COMMUNITY): Payer: Self-pay | Admitting: Maternal & Fetal Medicine

## 2018-03-02 ENCOUNTER — Ambulatory Visit (HOSPITAL_COMMUNITY)
Admission: RE | Admit: 2018-03-02 | Discharge: 2018-03-02 | Disposition: A | Discharge status: Home / Self Care | Payer: Medicaid Other | Source: Ambulatory Visit | Attending: Certified Nurse Midwife | Admitting: Certified Nurse Midwife

## 2018-03-02 DIAGNOSIS — Z3A25 25 weeks gestation of pregnancy: Secondary | ICD-10-CM

## 2018-03-02 DIAGNOSIS — Z8632 Personal history of gestational diabetes: Secondary | ICD-10-CM

## 2018-03-02 DIAGNOSIS — O09292 Supervision of pregnancy with other poor reproductive or obstetric history, second trimester: Secondary | ICD-10-CM | POA: Diagnosis not present

## 2018-03-02 DIAGNOSIS — Z362 Encounter for other antenatal screening follow-up: Secondary | ICD-10-CM

## 2018-03-02 DIAGNOSIS — Z8759 Personal history of other complications of pregnancy, childbirth and the puerperium: Secondary | ICD-10-CM

## 2018-03-18 ENCOUNTER — Ambulatory Visit (INDEPENDENT_AMBULATORY_CARE_PROVIDER_SITE_OTHER): Payer: Medicaid Other | Admitting: Obstetrics & Gynecology

## 2018-03-18 VITALS — BP 124/76 | HR 91 | Wt 204.0 lb

## 2018-03-18 DIAGNOSIS — O24410 Gestational diabetes mellitus in pregnancy, diet controlled: Secondary | ICD-10-CM

## 2018-03-18 DIAGNOSIS — Z3480 Encounter for supervision of other normal pregnancy, unspecified trimester: Secondary | ICD-10-CM

## 2018-03-18 NOTE — Patient Instructions (Signed)

## 2018-03-18 NOTE — Progress Notes (Signed)
   PRENATAL VISIT NOTE  Subjective:  Tiffany Hoover is a 27 y.o. G3P1011 at 33105w0d being seen today for ongoing prenatal care.  She is currently monitored for the following issues for this high-risk pregnancy and has Supervision of other normal pregnancy, antepartum; History of gestational diabetes in prior pregnancy, currently pregnant; History of gestational hypertension; and Gestational diabetes on their problem list.  Patient reports no complaints.  Contractions: Not present. Vag. Bleeding: None.  Movement: Present. Denies leaking of fluid.   The following portions of the patient's history were reviewed and updated as appropriate: allergies, current medications, past family history, past medical history, past social history, past surgical history and problem list. Problem list updated.  Objective:   Vitals:   03/18/18 1629  BP: 124/76  Pulse: 91  Weight: 204 lb (92.5 kg)    Fetal Status: Fetal Heart Rate (bpm): 150   Movement: Present     General:  Alert, oriented and cooperative. Patient is in no acute distress.  Skin: Skin is warm and dry. No rash noted.   Cardiovascular: Normal heart rate noted  Respiratory: Normal respiratory effort, no problems with respiration noted  Abdomen: Soft, gravid, appropriate for gestational age.  Pain/Pressure: Present     Pelvic: Cervical exam deferred        Extremities: Normal range of motion.     Mental Status: Normal mood and affect. Normal behavior. Normal judgment and thought content.   Assessment and Plan:  Pregnancy: G3P1011 at 38105w0d  1. Diet controlled gestational diabetes mellitus (GDM) in third trimester Needs to start diet and BG monitoring - [facility] referral to [facility]  2. Supervision of other normal pregnancy, antepartum   Preterm labor symptoms and general obstetric precautions including but not limited to vaginal bleeding, contractions, leaking of fluid and fetal movement were reviewed in detail  with the patient. Please refer to After Visit Summary for other counseling recommendations.  Return in about 2 weeks (around 04/01/2018).  No future appointments.  Scheryl DarterJames Arnold, MD

## 2018-03-25 ENCOUNTER — Other Ambulatory Visit: Payer: Self-pay

## 2018-03-25 ENCOUNTER — Encounter: Payer: Self-pay | Admitting: Registered"

## 2018-03-25 ENCOUNTER — Encounter: Payer: Medicaid Other | Attending: Obstetrics & Gynecology | Admitting: Registered"

## 2018-03-25 DIAGNOSIS — O24410 Gestational diabetes mellitus in pregnancy, diet controlled: Secondary | ICD-10-CM | POA: Diagnosis not present

## 2018-03-25 DIAGNOSIS — Z713 Dietary counseling and surveillance: Secondary | ICD-10-CM | POA: Insufficient documentation

## 2018-03-25 DIAGNOSIS — O24419 Gestational diabetes mellitus in pregnancy, unspecified control: Secondary | ICD-10-CM

## 2018-03-25 MED ORDER — ACCU-CHEK FASTCLIX LANCETS MISC: 100.0000 | Freq: Four times a day (QID) | 12 refills | Status: DC

## 2018-03-25 MED ORDER — ACCU-CHEK GUIDE W/DEVICE KIT: 1.0000 | PACK | Freq: Four times a day (QID) | 0 refills | Status: DC

## 2018-03-25 MED ORDER — GLUCOSE BLOOD VI STRP: ORAL_STRIP | 12 refills | Status: DC

## 2018-03-25 NOTE — Progress Notes (Signed)
Patient was seen on 03/25/18 for Gestational Diabetes self-management class at [facility]. The following learning objectives were met by the patient during this course:   States the definition of Gestational Diabetes  States why dietary management is important in controlling blood glucose  Describes the effects each nutrient has on blood glucose levels  Demonstrates ability to create a balanced meal plan  Demonstrates carbohydrate counting   States when to check blood glucose levels  Demonstrates proper blood glucose monitoring techniques  States the effect of stress and exercise on blood glucose levels  States the importance of limiting caffeine and abstaining from alcohol and smoking  Blood glucose monitor given: Accu-Chek Guide Lot # [id number] Exp: 03/21/2019 Blood glucose reading: 97  Patient instructed to monitor glucose levels: FBS: 60 - <95; 1 hour: <140; 2 hour: <120  Patient received handouts:  Nutrition Diabetes and Pregnancy, including carb counting list  Patient will be seen for follow-up as needed.

## 2018-04-01 ENCOUNTER — Encounter: Payer: Self-pay | Admitting: Obstetrics and Gynecology

## 2018-04-01 ENCOUNTER — Ambulatory Visit (INDEPENDENT_AMBULATORY_CARE_PROVIDER_SITE_OTHER): Payer: Medicaid Other | Admitting: Obstetrics and Gynecology

## 2018-04-01 VITALS — BP 124/81 | HR 106 | Wt 204.6 lb

## 2018-04-01 DIAGNOSIS — Z3483 Encounter for supervision of other normal pregnancy, third trimester: Secondary | ICD-10-CM

## 2018-04-01 DIAGNOSIS — Z3480 Encounter for supervision of other normal pregnancy, unspecified trimester: Secondary | ICD-10-CM

## 2018-04-01 DIAGNOSIS — O24410 Gestational diabetes mellitus in pregnancy, diet controlled: Secondary | ICD-10-CM

## 2018-04-01 NOTE — Progress Notes (Signed)
Subjective:  Tiffany Hoover is a 27 y.o. G3P1011 at 4452w0d being seen today for ongoing prenatal care.  She is currently monitored for the following issues for this high-risk pregnancy and has Supervision of other normal pregnancy, antepartum; History of gestational diabetes in prior pregnancy, currently pregnant; History of gestational hypertension; and Gestational diabetes mellitus (GDM), antepartum on their problem list.  Patient reports no complaints.  Contractions: Not present. Vag. Bleeding: None.  Movement: Present. Denies leaking of fluid.   The following portions of the patient's history were reviewed and updated as appropriate: allergies, current medications, past family history, past medical history, past social history, past surgical history and problem list. Problem list updated.  Objective:   Vitals:   04/01/18 1605  BP: 124/81  Pulse: (!) 106  Weight: 204 lb 9.6 oz (92.8 kg)    Fetal Status: Fetal Heart Rate (bpm): 155   Movement: Present     General:  Alert, oriented and cooperative. Patient is in no acute distress.  Skin: Skin is warm and dry. No rash noted.   Cardiovascular: Normal heart rate noted  Respiratory: Normal respiratory effort, no problems with respiration noted  Abdomen: Soft, gravid, appropriate for gestational age. Pain/Pressure: Present     Pelvic:  Cervical exam deferred        Extremities: Normal range of motion.  Edema: Trace  Mental Status: Normal mood and affect. Normal behavior. Normal judgment and thought content.   Urinalysis:      Assessment and Plan:  Pregnancy: G3P1011 at 6252w0d  1. Supervision of other normal pregnancy, antepartum Stable  2. Diet controlled gestational diabetes mellitus (GDM), antepartum Has not been checking Fasting CBG's. Pt instructed on checking CBG's fasting and 2 hrs after meals Goals reviewed. Majority of CBG's in goal range, outliers related to diet choices. Diet reviewed Continue with diet    Preterm labor  symptoms and general obstetric precautions including but not limited to vaginal bleeding, contractions, leaking of fluid and fetal movement were reviewed in detail with the patient. Please refer to After Visit Summary for other counseling recommendations.  Return in about 2 weeks (around 04/15/2018) for OB visit.   Hermina StaggersErvin, Marnisha Stampley L, MD

## 2018-04-08 IMAGING — US US MFM OB FOLLOW-UP
1 series · 14 of 28 positions shown · non-contrast
Comparison: none

[Series 1: us mfm ob follow-up · 45 acquisitions, 14 frames shown]
[im 2/45]
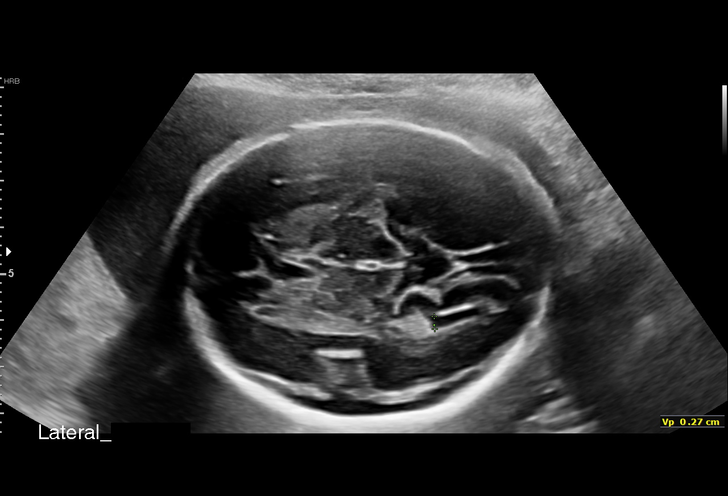
[im 5/45]
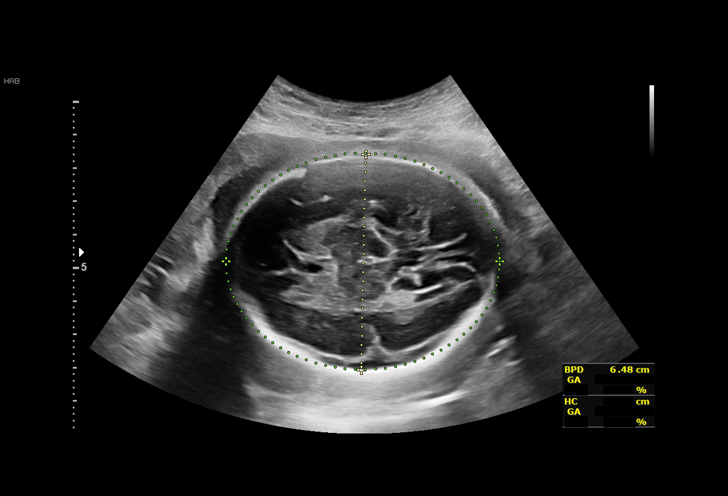
[im 9/45]
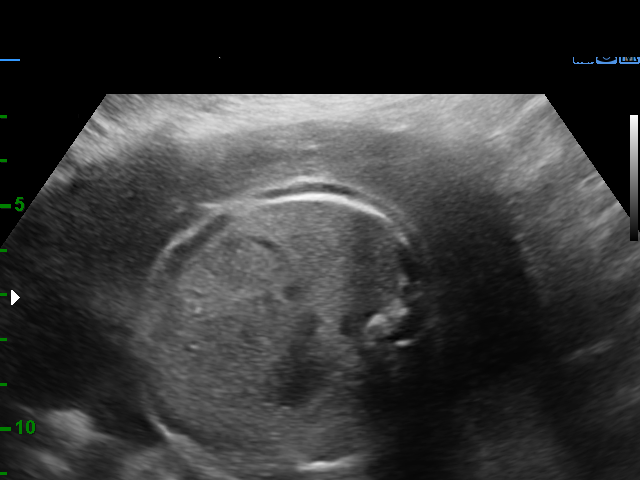
[im 12/45]
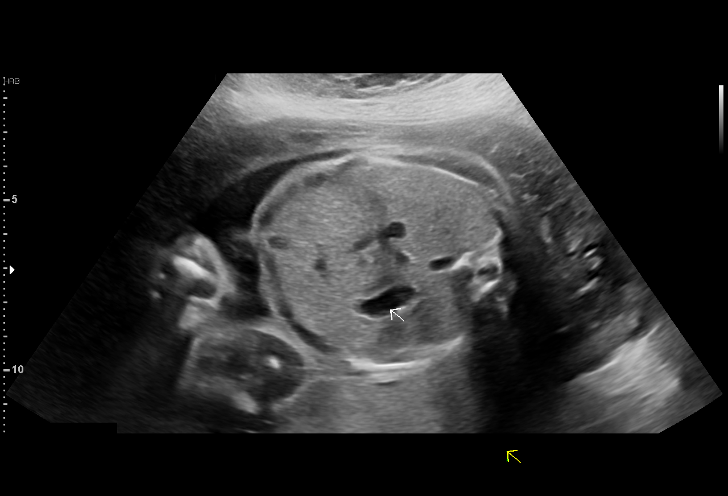
[im 15/45]
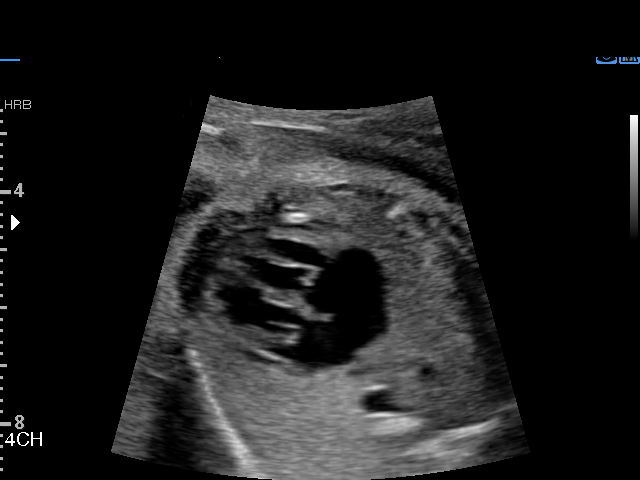
[im 18/45]
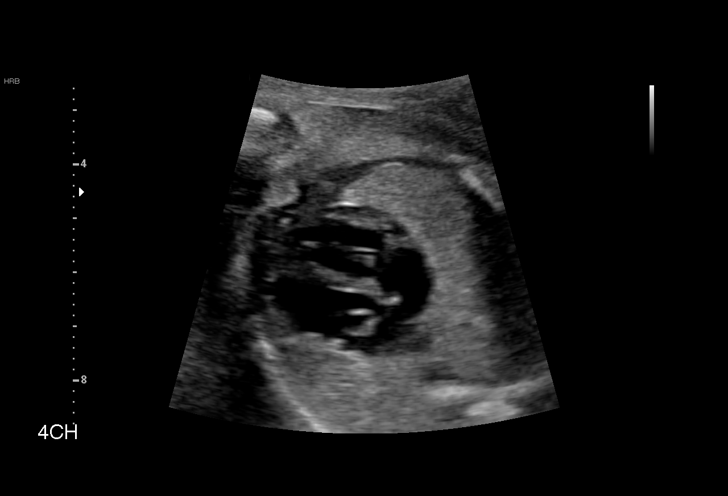
[im 22/45]
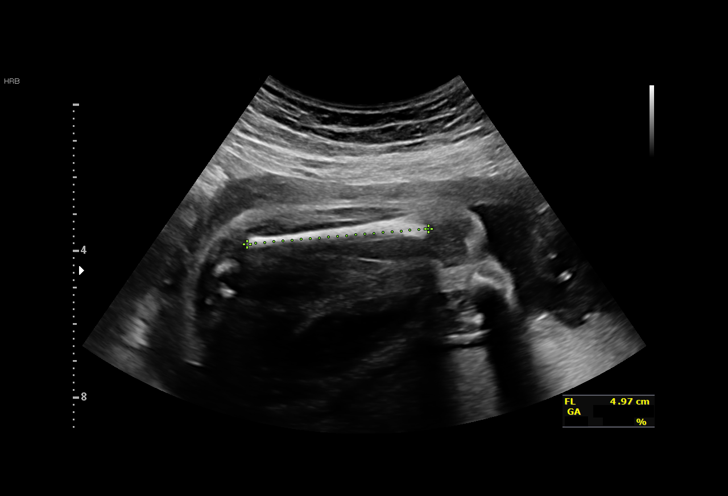
[im 25/45]
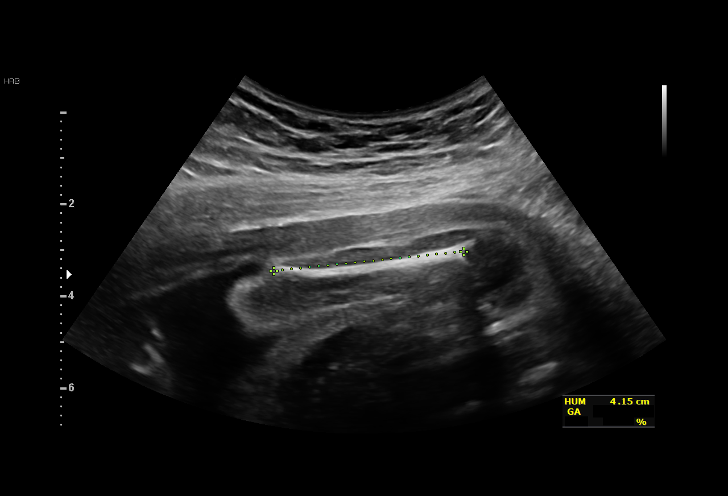
[im 28/45]
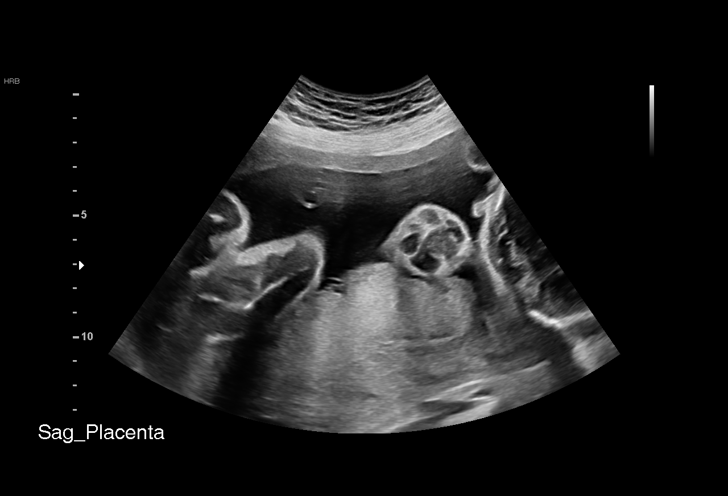
[im 31/45]
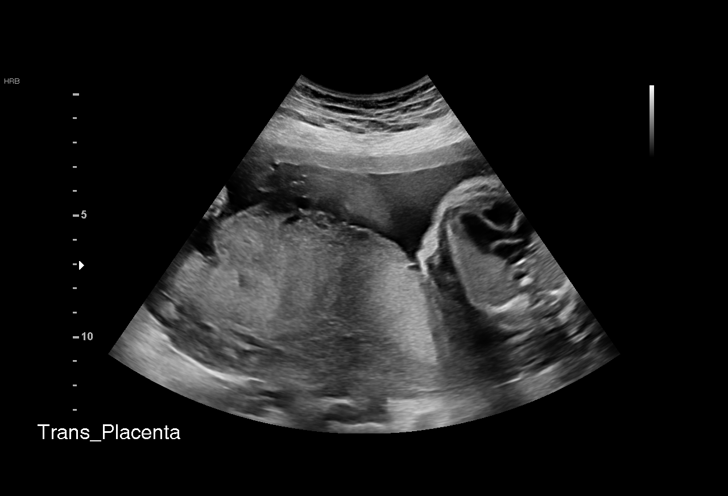
[im 35/45]
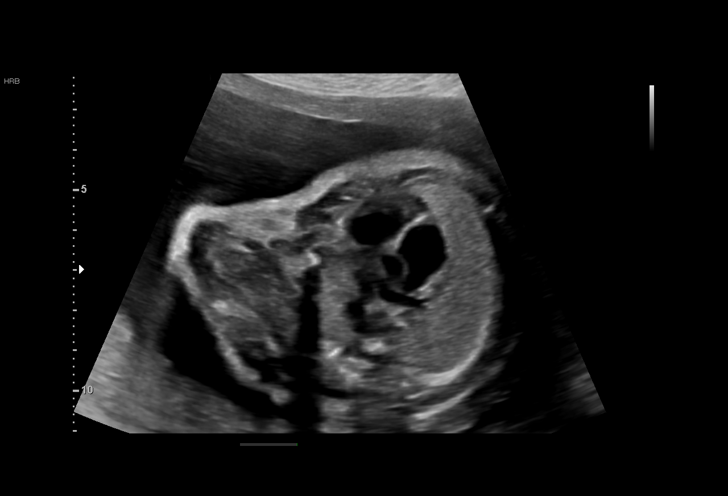
[im 38/45]
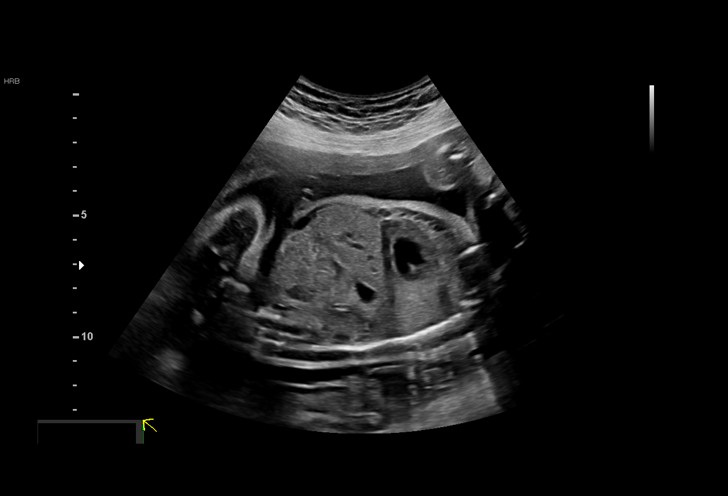
[im 41/45]
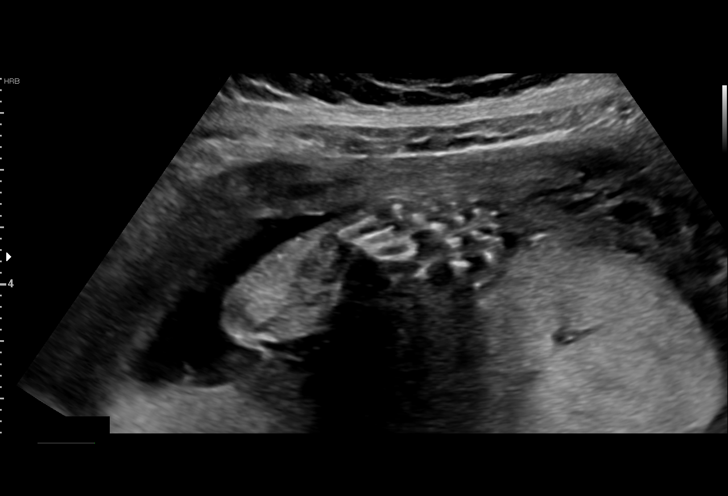
[im 45/45]
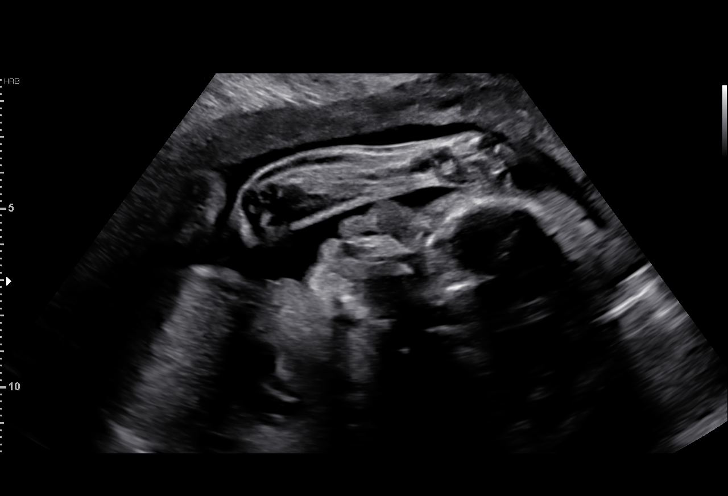

[14 of 28 positions shown; findings below may reference images not displayed]

[REDACTED]care -
[REDACTED]
[REDACTED]

1  PADDY SAHN DEVALLY              [id number]      [id number]     [id number]
Indications

25 weeks gestation of pregnancy
Poor obstetric history: Previous gestational
diabetes
Poor obstetric history: Previous gestational
HTN
Antenatal follow-up for nonvisualized fetal
anatomy
OB History

Gravidity:    3         Term:   1         SAB:   1
Living:       1
Fetal Evaluation

Num Of Fetuses:     1
Fetal Heart         151
Rate(bpm):
Cardiac Activity:   Observed
Presentation:       Cephalic
Placenta:           Posterior, above cervical os
P. Cord Insertion:  Previously Visualized
Amniotic Fluid
AFI FV:      Subjectively within normal limits

Largest Pocket(cm)
4.75
Biometry

BPD:      64.7  mm     G. Age:  26w 1d         56  %    CI:        77.89   %    70 - 86
FL/HC:      21.3   %    18.6 -
HC:       232   mm     G. Age:  25w 1d         15  %    HC/AC:      1.01        1.04 -
AC:      229.4  mm     G. Age:  27w 2d         85  %    FL/BPD:     76.2   %    71 - 87
FL:       49.3  mm     G. Age:  26w 4d         63  %    FL/AC:      21.5   %    20 - 24
HUM:      41.5  mm     G. Age:  25w 1d         28  %

Est. FW:     983  gm      2 lb 3 oz     75  %
Gestational Age

LMP:           24w 4d        Date:  09/11/17                 EDD:   06/18/18
U/S Today:     26w 2d                                        EDD:   06/06/18
Best:          25w 5d     Det. By:  Early Ultrasound         EDD:   06/10/18
(10/13/17)
Anatomy

Cranium:               Appears normal         Aortic Arch:            Previously seen
Cavum:                 Appears normal         Ductal Arch:            Previously seen
Ventricles:            Appears normal         Diaphragm:              Appears normal
Choroid Plexus:        Previously seen        Stomach:                Appears normal, left
sided
Cerebellum:            Previously seen        Abdomen:                Appears normal
Posterior Fossa:       Previously seen        Abdominal Wall:         Previously seen
Nuchal Fold:           Previously seen        Cord Vessels:           Previously seen
Face:                  Orbits and profile     Kidneys:                Appear normal
previously seen
Lips:                  Previously seen        Bladder:                Appears normal
Thoracic:              Appears normal         Spine:                  Ltd views no
intracranial signs of
NT
Heart:                 Appears normal         Upper Extremities:      Previously seen
(4CH, axis, and situs
RVOT:                  Appears normal         Lower Extremities:      Previously seen
LVOT:                  Appears normal

Other:  Fetus appears to be a female. Heels and 5th digit visualized.
Cervix Uterus Adnexa

Cervix
Length:           3.24  cm.
Normal appearance by transabdominal scan.

Uterus
No abnormality visualized.
Comments

Impression

Single living intrauterine pregnancy at 25w 5d.
Placenta Posterior, above cervical os.
Appropriate fetal growth.
Normal amniotic fluid volume.
The fetal anatomic survey is not complete secondary to fetal
position
The cervix measures 3.24cm transabdominally without
funneling.
Recommendations

Recommend follow-up ultrasound examination in 
 4 weeks
or as clinically indicated

## 2018-04-15 ENCOUNTER — Ambulatory Visit (INDEPENDENT_AMBULATORY_CARE_PROVIDER_SITE_OTHER): Payer: Medicaid Other | Admitting: Obstetrics and Gynecology

## 2018-04-15 ENCOUNTER — Encounter: Payer: Self-pay | Admitting: Obstetrics and Gynecology

## 2018-04-15 VITALS — BP 115/75 | HR 97 | Wt 207.5 lb

## 2018-04-15 DIAGNOSIS — Z3480 Encounter for supervision of other normal pregnancy, unspecified trimester: Secondary | ICD-10-CM

## 2018-04-15 DIAGNOSIS — O24410 Gestational diabetes mellitus in pregnancy, diet controlled: Secondary | ICD-10-CM

## 2018-04-15 DIAGNOSIS — Z8759 Personal history of other complications of pregnancy, childbirth and the puerperium: Secondary | ICD-10-CM

## 2018-04-15 NOTE — Progress Notes (Signed)
Pt presents for ROB with no complaints today per pt.

## 2018-04-15 NOTE — Progress Notes (Signed)
Subjective:  Tiffany Hoover is a 27 y.o. G3P1011 at [redacted]w[redacted]d being seen today for ongoing prenatal care.  She is currently monitored for the following issues for this high-risk pregnancy and has Supervision of other normal pregnancy, antepartum; History of gestational diabetes in prior pregnancy, currently pregnant; History of gestational hypertension; and Gestational diabetes mellitus (GDM), antepartum on their problem list.  Patient reports no complaints.  Contractions: Not present. Vag. Bleeding: None.  Movement: Present. Denies leaking of fluid.   The following portions of the patient's history were reviewed and updated as appropriate: allergies, current medications, past family history, past medical history, past social history, past surgical history and problem list. Problem list updated.  Objective:   Vitals:   04/15/18 1559  BP: 115/75  Pulse: 97  Weight: 94.1 kg (207 lb 8 oz)    Fetal Status:     Movement: Present     General:  Alert, oriented and cooperative. Patient is in no acute distress.  Skin: Skin is warm and dry. No rash noted.   Cardiovascular: Normal heart rate noted  Respiratory: Normal respiratory effort, no problems with respiration noted  Abdomen: Soft, gravid, appropriate for gestational age. Pain/Pressure: Absent     Pelvic:  Cervical exam deferred        Extremities: Normal range of motion.  Edema: Trace  Mental Status: Normal mood and affect. Normal behavior. Normal judgment and thought content.   Urinalysis:      Assessment and Plan:  Pregnancy: G3P1011 at [redacted]w[redacted]d  1. Supervision of other normal pregnancy, antepartum Stable  2. Diet controlled gestational diabetes mellitus (GDM), antepartum CBG's in goal range >90 % Outliers related to diet choices Continue with diet - [place] MFM OB FOLLOW UP; Future  3. History of gestational hypertension BP stable without meds Continue with BASA  Preterm labor symptoms and general obstetric precautions including but not  limited to vaginal bleeding, contractions, leaking of fluid and fetal movement were reviewed in detail with the patient. Please refer to After Visit Summary for other counseling recommendations.  No follow-ups on file.   Hermina Staggers, MD

## 2018-04-29 ENCOUNTER — Ambulatory Visit (INDEPENDENT_AMBULATORY_CARE_PROVIDER_SITE_OTHER): Payer: Medicaid Other | Admitting: Obstetrics and Gynecology

## 2018-04-29 ENCOUNTER — Encounter: Payer: Self-pay | Admitting: Obstetrics and Gynecology

## 2018-04-29 VITALS — BP 118/81 | HR 94 | Wt 212.4 lb

## 2018-04-29 DIAGNOSIS — O24410 Gestational diabetes mellitus in pregnancy, diet controlled: Secondary | ICD-10-CM

## 2018-04-29 DIAGNOSIS — Z3480 Encounter for supervision of other normal pregnancy, unspecified trimester: Secondary | ICD-10-CM

## 2018-04-29 DIAGNOSIS — Z8759 Personal history of other complications of pregnancy, childbirth and the puerperium: Secondary | ICD-10-CM

## 2018-04-29 MED ORDER — COMFORT FIT MATERNITY SUPP MED MISC: 0 refills | Status: DC

## 2018-04-29 NOTE — Progress Notes (Signed)
   PRENATAL VISIT NOTE  Subjective:  Tiffany Hoover is a 27 y.o. G3P1011 at [redacted]w[redacted]d being seen today for ongoing prenatal care.  She is currently monitored for the following issues for this high-risk pregnancy and has Supervision of other normal pregnancy, antepartum; History of gestational diabetes in prior pregnancy, currently pregnant; History of gestational hypertension; and Gestational diabetes mellitus (GDM), antepartum on their problem list.  Patient reports backache.  Contractions: Not present. Vag. Bleeding: None.  Movement: Present. Denies leaking of fluid.   The following portions of the patient's history were reviewed and updated as appropriate: allergies, current medications, past family history, past medical history, past social history, past surgical history and problem list. Problem list updated.  Objective:   Vitals:   04/29/18 1603  BP: 118/81  Pulse: 94  Weight: 212 lb 6.4 oz (96.3 kg)    Fetal Status: Fetal Heart Rate (bpm): 145 Fundal Height: 35 cm Movement: Present     General:  Alert, oriented and cooperative. Patient is in no acute distress.  Skin: Skin is warm and dry. No rash noted.   Cardiovascular: Normal heart rate noted  Respiratory: Normal respiratory effort, no problems with respiration noted  Abdomen: Soft, gravid, appropriate for gestational age.  Pain/Pressure: Present     Pelvic: Cervical exam deferred        Extremities: Normal range of motion.  Edema: Trace  Mental Status: Normal mood and affect. Normal behavior. Normal judgment and thought content.   Assessment and Plan:  Pregnancy: G3P1011 at [redacted]w[redacted]d  1. Supervision of other normal pregnancy, antepartum Patient is doing well Rx maternity support belt provided Cultures next visit  2. Diet controlled gestational diabetes mellitus (GDM), antepartum CBGs reviewed and all pp within range. Fasting 93-108 patient states that she wakes up at 3 am everyday to consume a meal due to hunger.  Advised  patient to consume a protein rich snack at bedtime in order to avoid eating at 3 am Continue diet control  Follow up growth ultrasound  3. History of gestational hypertension Normotensive without meds Continue ASA  Preterm labor symptoms and general obstetric precautions including but not limited to vaginal bleeding, contractions, leaking of fluid and fetal movement were reviewed in detail with the patient. Please refer to After Visit Summary for other counseling recommendations.  Return in about 2 weeks (around 05/13/2018) for ROB, High risk.  Future Appointments  Date Time Provider Department Center  05/13/2018  4:15 PM Conan Bowens, MD [facility] None  05/19/2018  4:00 PM [facility] [place] [facility]    Catalina Antigua, MD

## 2018-05-13 ENCOUNTER — Ambulatory Visit (INDEPENDENT_AMBULATORY_CARE_PROVIDER_SITE_OTHER): Payer: Medicaid Other | Admitting: Obstetrics and Gynecology

## 2018-05-13 ENCOUNTER — Other Ambulatory Visit (HOSPITAL_COMMUNITY)
Admission: RE | Admit: 2018-05-13 | Discharge: 2018-05-13 | Disposition: A | Discharge status: Home / Self Care | Payer: Medicaid Other | Source: Ambulatory Visit | Attending: Obstetrics and Gynecology | Admitting: Obstetrics and Gynecology

## 2018-05-13 ENCOUNTER — Encounter: Payer: Self-pay | Admitting: Obstetrics and Gynecology

## 2018-05-13 VITALS — BP 124/79 | HR 103 | Wt 211.2 lb

## 2018-05-13 DIAGNOSIS — O24410 Gestational diabetes mellitus in pregnancy, diet controlled: Secondary | ICD-10-CM

## 2018-05-13 DIAGNOSIS — Z8759 Personal history of other complications of pregnancy, childbirth and the puerperium: Secondary | ICD-10-CM

## 2018-05-13 DIAGNOSIS — Z3483 Encounter for supervision of other normal pregnancy, third trimester: Secondary | ICD-10-CM | POA: Diagnosis present

## 2018-05-13 DIAGNOSIS — Z8632 Personal history of gestational diabetes: Secondary | ICD-10-CM

## 2018-05-13 DIAGNOSIS — Z3A36 36 weeks gestation of pregnancy: Secondary | ICD-10-CM | POA: Diagnosis not present

## 2018-05-13 DIAGNOSIS — Z3480 Encounter for supervision of other normal pregnancy, unspecified trimester: Secondary | ICD-10-CM

## 2018-05-13 DIAGNOSIS — O09299 Supervision of pregnancy with other poor reproductive or obstetric history, unspecified trimester: Secondary | ICD-10-CM

## 2018-05-13 LAB — OB RESULTS CONSOLE GC/CHLAMYDIA: Gonorrhea: NEGATIVE

## 2018-05-13 NOTE — Progress Notes (Signed)
   PRENATAL VISIT NOTE  Subjective:  Tiffany Hoover is a 27 y.o. G3P1011 at [redacted]w[redacted]d being seen today for ongoing prenatal care.  She is currently monitored for the following issues for this high-risk pregnancy and has Supervision of other normal pregnancy, antepartum; History of gestational diabetes in prior pregnancy, currently pregnant; History of gestational hypertension; and Gestational diabetes mellitus (GDM), antepartum on their problem list.  Patient reports vaginal pressure.  Contractions: Not present. Vag. Bleeding: None.  Movement: Present. Denies leaking of fluid.   The following portions of the patient's history were reviewed and updated as appropriate: allergies, current medications, past family history, past medical history, past social history, past surgical history and problem list. Problem list updated.  Objective:   Vitals:   05/13/18 1626  BP: 124/79  Pulse: (!) 103  Weight: 211 lb 3.2 oz (95.8 kg)    Fetal Status: Fetal Heart Rate (bpm): 158   Movement: Present     General:  Alert, oriented and cooperative. Patient is in no acute distress.  Skin: Skin is warm and dry. No rash noted.   Cardiovascular: Normal heart rate noted  Respiratory: Normal respiratory effort, no problems with respiration noted  Abdomen: Soft, gravid, appropriate for gestational age.  Pain/Pressure: Present     Pelvic: Cervical exam deferred        Extremities: Normal range of motion.  Edema: Trace  Mental Status: Normal mood and affect. Normal behavior. Normal judgment and thought content.   Assessment and Plan:  Pregnancy: G3P1011 at [redacted]w[redacted]d  1. Supervision of other normal pregnancy, antepartum  2. History of gestational diabetes in prior pregnancy, currently pregnant  3. History of gestational hypertension  4. Diet controlled gestational diabetes mellitus (GDM), antepartum All PP < 120 FG: all < 95 Cont diet control Growth scheduled for 05/19/18  Preterm labor symptoms and general  obstetric precautions including but not limited to vaginal bleeding, contractions, leaking of fluid and fetal movement were reviewed in detail with the patient. Please refer to After Visit Summary for other counseling recommendations.  Return in about 1 week (around 05/20/2018) for OB visit (MD).  Future Appointments  Date Time Provider Department Center  05/19/2018  4:00 PM [facility] [place] [facility]  05/20/2018  4:15 PM Hermina Staggers, MD [facility] None    Conan Bowens, MD

## 2018-05-14 ENCOUNTER — Telehealth: Payer: Self-pay

## 2018-05-14 NOTE — Telephone Encounter (Signed)
Pt 36 wks, stop baby ASA per Dr. Earlene Plater. Pt agrees and has no further questions.

## 2018-05-15 LAB — CERVICOVAGINAL ANCILLARY ONLY
Chlamydia: NEGATIVE
NEISSERIA GONORRHEA: NEGATIVE

## 2018-05-15 LAB — STREP GP B NAA: STREP GROUP B AG: NEGATIVE

## 2018-05-19 ENCOUNTER — Encounter: Payer: Self-pay | Admitting: *Deleted

## 2018-05-19 ENCOUNTER — Other Ambulatory Visit: Payer: Self-pay | Admitting: Obstetrics and Gynecology

## 2018-05-19 ENCOUNTER — Ambulatory Visit (HOSPITAL_COMMUNITY)
Admission: RE | Admit: 2018-05-19 | Discharge: 2018-05-19 | Disposition: A | Discharge status: Home / Self Care | Payer: Medicaid Other | Source: Ambulatory Visit | Attending: Obstetrics and Gynecology | Admitting: Obstetrics and Gynecology

## 2018-05-19 DIAGNOSIS — Z0489 Encounter for examination and observation for other specified reasons: Secondary | ICD-10-CM

## 2018-05-19 DIAGNOSIS — IMO0002 Reserved for concepts with insufficient information to code with codable children: Secondary | ICD-10-CM

## 2018-05-19 DIAGNOSIS — Z3A36 36 weeks gestation of pregnancy: Secondary | ICD-10-CM | POA: Insufficient documentation

## 2018-05-19 DIAGNOSIS — O09893 Supervision of other high risk pregnancies, third trimester: Secondary | ICD-10-CM | POA: Insufficient documentation

## 2018-05-19 DIAGNOSIS — O24410 Gestational diabetes mellitus in pregnancy, diet controlled: Secondary | ICD-10-CM

## 2018-05-20 ENCOUNTER — Encounter: Payer: Self-pay | Admitting: Obstetrics and Gynecology

## 2018-05-20 ENCOUNTER — Ambulatory Visit (INDEPENDENT_AMBULATORY_CARE_PROVIDER_SITE_OTHER): Payer: Medicaid Other | Admitting: Obstetrics and Gynecology

## 2018-05-20 VITALS — BP 134/78 | HR 96 | Wt 213.8 lb

## 2018-05-20 DIAGNOSIS — Z8759 Personal history of other complications of pregnancy, childbirth and the puerperium: Secondary | ICD-10-CM

## 2018-05-20 DIAGNOSIS — O24410 Gestational diabetes mellitus in pregnancy, diet controlled: Secondary | ICD-10-CM

## 2018-05-20 DIAGNOSIS — Z3480 Encounter for supervision of other normal pregnancy, unspecified trimester: Secondary | ICD-10-CM

## 2018-05-20 NOTE — Patient Instructions (Signed)
Vaginal Delivery Vaginal delivery means that you will give birth by pushing your baby out of your birth canal (vagina). A team of health care providers will help you before, during, and after vaginal delivery. Birth experiences are unique for every woman and every pregnancy, and birth experiences vary depending on where you choose to give birth. What should I do to prepare for my baby's birth? Before your baby is born, it is important to talk with your health care provider about:  Your labor and delivery preferences. These may include: ? Medicines that you may be given. ? How you will manage your pain. This might include non-medical pain relief techniques or injectable pain relief such as epidural analgesia. ? How you and your baby will be monitored during labor and delivery. ? Who may be in the labor and delivery room with you. ? Your feelings about surgical delivery of your baby (cesarean delivery, or C-section) if this becomes necessary. ? Your feelings about receiving donated blood through an IV tube (blood transfusion) if this becomes necessary.  Whether you are able: ? To take pictures or videos of the birth. ? To eat during labor and delivery. ? To move around, walk, or change positions during labor and delivery.  What to expect after your baby is born, such as: ? Whether delayed umbilical cord clamping and cutting is offered. ? Who will care for your baby right after birth. ? Medicines or tests that may be recommended for your baby. ? Whether breastfeeding is supported in your hospital or birth center. ? How long you will be in the hospital or birth center.  How any medical conditions you have may affect your baby or your labor and delivery experience.  To prepare for your baby's birth, you should also:  Attend all of your health care visits before delivery (prenatal visits) as recommended by your health care provider. This is important.  Prepare your home for your baby's  arrival. Make sure that you have: ? Diapers. ? Baby clothing. ? Feeding equipment. ? Safe sleeping arrangements for you and your baby.  Install a car seat in your vehicle. Have your car seat checked by a certified car seat installer to make sure that it is installed safely.  Think about who will help you with your new baby at home for at least the first several weeks after delivery.  What can I expect when I arrive at the birth center or hospital? Once you are in labor and have been admitted into the hospital or birth center, your health care provider may:  Review your pregnancy history and any concerns you have.  Insert an IV tube into one of your veins. This is used to give you fluids and medicines.  Check your blood pressure, pulse, temperature, and heart rate (vital signs).  Check whether your bag of water (amniotic sac) has broken (ruptured).  Talk with you about your birth plan and discuss pain control options.  Monitoring Your health care provider may monitor your contractions (uterine monitoring) and your baby's heart rate (fetal monitoring). You may need to be monitored:  Often, but not continuously (intermittently).  All the time or for long periods at a time (continuously). Continuous monitoring may be needed if: ? You are taking certain medicines, such as medicine to relieve pain or make your contractions stronger. ? You have pregnancy or labor complications.  Monitoring may be done by:  Placing a special stethoscope or a handheld monitoring device on your abdomen to   check your baby's heartbeat, and feeling your abdomen for contractions. This method of monitoring does not continuously record your baby's heartbeat or your contractions.  Placing monitors on your abdomen (external monitors) to record your baby's heartbeat and the frequency and length of contractions. You may not have to wear external monitors all the time.  Placing monitors inside of your uterus  (internal monitors) to record your baby's heartbeat and the frequency, length, and strength of your contractions. ? Your health care provider may use internal monitors if he or she needs more information about the strength of your contractions or your baby's heart rate. ? Internal monitors are put in place by passing a thin, flexible wire through your vagina and into your uterus. Depending on the type of monitor, it may remain in your uterus or on your baby's head until birth. ? Your health care provider will discuss the benefits and risks of internal monitoring with you and will ask for your permission before inserting the monitors.  Telemetry. This is a type of continuous monitoring that can be done with external or internal monitors. Instead of having to stay in bed, you are able to move around during telemetry. Ask your health care provider if telemetry is an option for you.  Physical exam Your health care provider may perform a physical exam. This may include:  Checking whether your baby is positioned: ? With the head toward your vagina (head-down). This is most common. ? With the head toward the top of your uterus (head-up or breech). If your baby is in a breech position, your health care provider may try to turn your baby to a head-down position so you can deliver vaginally. If it does not seem that your baby can be born vaginally, your provider may recommend surgery to deliver your baby. In rare cases, you may be able to deliver vaginally if your baby is head-up (breech delivery). ? Lying sideways (transverse). Babies that are lying sideways cannot be delivered vaginally.  Checking your cervix to determine: ? Whether it is thinning out (effacing). ? Whether it is opening up (dilating). ? How low your baby has moved into your birth canal.  What are the three stages of labor and delivery?  Normal labor and delivery is divided into the following three stages: Stage 1  Stage 1 is the  longest stage of labor, and it can last for hours or days. Stage 1 includes: ? Early labor. This is when contractions may be irregular, or regular and mild. Generally, early labor contractions are more than 10 minutes apart. ? Active labor. This is when contractions get longer, more regular, more frequent, and more intense. ? The transition phase. This is when contractions happen very close together, are very intense, and may last longer than during any other part of labor.  Contractions generally feel mild, infrequent, and irregular at first. They get stronger, more frequent (about every 2-3 minutes), and more regular as you progress from early labor through active labor and transition.  Many women progress through stage 1 naturally, but you may need help to continue making progress. If this happens, your health care provider may talk with you about: ? Rupturing your amniotic sac if it has not ruptured yet. ? Giving you medicine to help make your contractions stronger and more frequent.  Stage 1 ends when your cervix is completely dilated to 4 inches (10 cm) and completely effaced. This happens at the end of the transition phase. Stage 2  Once   your cervix is completely effaced and dilated to 4 inches (10 cm), you may start to feel an urge to push. It is common for the body to naturally take a rest before feeling the urge to push, especially if you received an epidural or certain other pain medicines. This rest period may last for up to 1-2 hours, depending on your unique labor experience.  During stage 2, contractions are generally less painful, because pushing helps relieve contraction pain. Instead of contraction pain, you may feel stretching and burning pain, especially when the widest part of your baby's head passes through the vaginal opening (crowning).  Your health care provider will closely monitor your pushing progress and your baby's progress through the vagina during stage 2.  Your  health care provider may massage the area of skin between your vaginal opening and anus (perineum) or apply warm compresses to your perineum. This helps it stretch as the baby's head starts to crown, which can help prevent perineal tearing. ? In some cases, an incision may be made in your perineum (episiotomy) to allow the baby to pass through the vaginal opening. An episiotomy helps to make the opening of the vagina larger to allow more room for the baby to fit through.  It is very important to breathe and focus so your health care provider can control the delivery of your baby's head. Your health care provider may have you decrease the intensity of your pushing, to help prevent perineal tearing.  After delivery of your baby's head, the shoulders and the rest of the body generally deliver very quickly and without difficulty.  Once your baby is delivered, the umbilical cord may be cut right away, or this may be delayed for 1-2 minutes, depending on your baby's health. This may vary among health care providers, hospitals, and birth centers.  If you and your baby are healthy enough, your baby may be placed on your chest or abdomen to help maintain the baby's temperature and to help you bond with each other. Some mothers and babies start breastfeeding at this time. Your health care team will dry your baby and help keep your baby warm during this time.  Your baby may need immediate care if he or she: ? Showed signs of distress during labor. ? Has a medical condition. ? Was born too early (prematurely). ? Had a bowel movement before birth (meconium). ? Shows signs of difficulty transitioning from being inside the uterus to being outside of the uterus. If you are planning to breastfeed, your health care team will help you begin a feeding. Stage 3  The third stage of labor starts immediately after the birth of your baby and ends after you deliver the placenta. The placenta is an organ that develops  during pregnancy to provide oxygen and nutrients to your baby in the womb.  Delivering the placenta may require some pushing, and you may have mild contractions. Breastfeeding can stimulate contractions to help you deliver the placenta.  After the placenta is delivered, your uterus should tighten (contract) and become firm. This helps to stop bleeding in your uterus. To help your uterus contract and to control bleeding, your health care provider may: ? Give you medicine by injection, through an IV tube, by mouth, or through your rectum (rectally). ? Massage your abdomen or perform a vaginal exam to remove any blood clots that are left in your uterus. ? Empty your bladder by placing a thin, flexible tube (catheter) into your bladder. ? Encourage   you to breastfeed your baby. After labor is over, you and your baby will be monitored closely to ensure that you are both healthy until you are ready to go home. Your health care team will teach you how to care for yourself and your baby. This information is not intended to replace advice given to you by your health care provider. Make sure you discuss any questions you have with your health care provider. Document Released: 09/10/2008 Document Revised: 06/21/2016 Document Reviewed: 12/17/2015 Elsevier Interactive Patient Education  2018 [employer]  

## 2018-05-20 NOTE — Progress Notes (Signed)
Subjective:  Tiffany Hoover is a 27 y.o. G3P1011 at 638w0d being seen today for ongoing prenatal care.  She is currently monitored for the following issues for this high-risk pregnancy and has Supervision of other normal pregnancy, antepartum; History of gestational diabetes in prior pregnancy, currently pregnant; History of gestational hypertension; and Gestational diabetes mellitus (GDM), antepartum on their problem list.  Patient reports no complaints.  Contractions: Irregular. Vag. Bleeding: None.  Movement: Present. Denies leaking of fluid.   The following portions of the patient's history were reviewed and updated as appropriate: allergies, current medications, past family history, past medical history, past social history, past surgical history and problem list. Problem list updated.  Objective:   Vitals:   05/20/18 1542  BP: 134/78  Pulse: 96  Weight: 213 lb 12.8 oz (97 kg)    Fetal Status: Fetal Heart Rate (bpm): 142   Movement: Present     General:  Alert, oriented and cooperative. Patient is in no acute distress.  Skin: Skin is warm and dry. No rash noted.   Cardiovascular: Normal heart rate noted  Respiratory: Normal respiratory effort, no problems with respiration noted  Abdomen: Soft, gravid, appropriate for gestational age. Pain/Pressure: Present     Pelvic:  Cervical exam deferred        Extremities: Normal range of motion.  Edema: Trace  Mental Status: Normal mood and affect. Normal behavior. Normal judgment and thought content.   Urinalysis:      Assessment and Plan:  Pregnancy: G3P1011 at 528w0d  1. Supervision of other normal pregnancy, antepartum Stable Labor precautions  2. Diet controlled gestational diabetes mellitus (GDM), antepartum CBG's in goal range Continue with diet Growth scan 05/19/18 > 90 % Schedule at 40 weeks  3. History of gestational hypertension BP stable  Term labor symptoms and general obstetric precautions including but not limited to  vaginal bleeding, contractions, leaking of fluid and fetal movement were reviewed in detail with the patient. Please refer to After Visit Summary for other counseling recommendations.  Return in about 1 week (around 05/27/2018) for OB visit.   Hermina StaggersErvin, Amory Simonetti L, MD

## 2018-05-27 ENCOUNTER — Encounter: Payer: Self-pay | Admitting: Obstetrics and Gynecology

## 2018-05-27 ENCOUNTER — Ambulatory Visit (INDEPENDENT_AMBULATORY_CARE_PROVIDER_SITE_OTHER): Payer: Medicaid Other | Admitting: Obstetrics and Gynecology

## 2018-05-27 ENCOUNTER — Other Ambulatory Visit: Payer: Self-pay

## 2018-05-27 VITALS — BP 128/86 | HR 96 | Wt 218.7 lb

## 2018-05-27 DIAGNOSIS — Z8759 Personal history of other complications of pregnancy, childbirth and the puerperium: Secondary | ICD-10-CM

## 2018-05-27 DIAGNOSIS — Z8632 Personal history of gestational diabetes: Secondary | ICD-10-CM

## 2018-05-27 DIAGNOSIS — O09299 Supervision of pregnancy with other poor reproductive or obstetric history, unspecified trimester: Secondary | ICD-10-CM

## 2018-05-27 DIAGNOSIS — O24410 Gestational diabetes mellitus in pregnancy, diet controlled: Secondary | ICD-10-CM

## 2018-05-27 DIAGNOSIS — Z3480 Encounter for supervision of other normal pregnancy, unspecified trimester: Secondary | ICD-10-CM

## 2018-05-27 DIAGNOSIS — O09293 Supervision of pregnancy with other poor reproductive or obstetric history, third trimester: Secondary | ICD-10-CM

## 2018-05-27 NOTE — Progress Notes (Signed)
   PRENATAL VISIT NOTE  Subjective:  Tiffany Hoover is a 27 y.o. G3P1011 at 6723w0d being seen today for ongoing prenatal care.  She is currently monitored for the following issues for this high-risk pregnancy and has Supervision of other normal pregnancy, antepartum; History of gestational diabetes in prior pregnancy, currently pregnant; History of gestational hypertension; and Gestational diabetes mellitus (GDM), antepartum on their problem list.  Patient reports occasional cramping.  Contractions: Irritability. Vag. Bleeding: None.  Movement: Present. Denies leaking of fluid.   The following portions of the patient's history were reviewed and updated as appropriate: allergies, current medications, past family history, past medical history, past social history, past surgical history and problem list. Problem list updated.  Objective:   Vitals:   05/27/18 1352  BP: 128/86  Pulse: 96  Weight: 218 lb 11.2 oz (99.2 kg)    Fetal Status: Fetal Heart Rate (bpm): 138   Movement: Present     General:  Alert, oriented and cooperative. Patient is in no acute distress.  Skin: Skin is warm and dry. No rash noted.   Cardiovascular: Normal heart rate noted  Respiratory: Normal respiratory effort, no problems with respiration noted  Abdomen: Soft, gravid, appropriate for gestational age.  Pain/Pressure: Present     Pelvic: Cervical exam deferred        Extremities: Normal range of motion.  Edema: Trace  Mental Status: Normal mood and affect. Normal behavior. Normal judgment and thought content.   Assessment and Plan:  Pregnancy: G3P1011 at 423w0d  1. Supervision of other normal pregnancy, antepartum  2. History of gestational hypertension BP stable  3. Diet controlled gestational diabetes mellitus (GDM), antepartum CBG within normal limits Very good control Cont diet control IOL scheduled for 40 weeks  Term labor symptoms and general obstetric precautions including but not limited to vaginal  bleeding, contractions, leaking of fluid and fetal movement were reviewed in detail with the patient. Please refer to After Visit Summary for other counseling recommendations.  Return in about 1 week (around 06/03/2018) for OB visit (MD).  Future Appointments  Date Time Provider Department Center  06/03/2018  3:45 PM Hermina StaggersErvin, Michael L, MD [facility] None    Conan BowensKelly M Calyse Murcia, MD

## 2018-05-27 NOTE — Progress Notes (Signed)
ROB reports no problems today. 

## 2018-05-28 ENCOUNTER — Telehealth (HOSPITAL_COMMUNITY): Payer: Self-pay | Admitting: *Deleted

## 2018-05-28 ENCOUNTER — Encounter (HOSPITAL_COMMUNITY): Payer: Self-pay | Admitting: *Deleted

## 2018-05-28 NOTE — Telephone Encounter (Signed)
Preadmission screen  

## 2018-06-03 ENCOUNTER — Encounter: Payer: Self-pay | Admitting: Obstetrics and Gynecology

## 2018-06-03 ENCOUNTER — Ambulatory Visit (INDEPENDENT_AMBULATORY_CARE_PROVIDER_SITE_OTHER): Payer: Medicaid Other | Admitting: Obstetrics and Gynecology

## 2018-06-03 VITALS — BP 143/94 | HR 87 | Wt 223.7 lb

## 2018-06-03 DIAGNOSIS — O24410 Gestational diabetes mellitus in pregnancy, diet controlled: Secondary | ICD-10-CM

## 2018-06-03 DIAGNOSIS — Z3483 Encounter for supervision of other normal pregnancy, third trimester: Secondary | ICD-10-CM

## 2018-06-03 DIAGNOSIS — Z3480 Encounter for supervision of other normal pregnancy, unspecified trimester: Secondary | ICD-10-CM

## 2018-06-03 DIAGNOSIS — Z8759 Personal history of other complications of pregnancy, childbirth and the puerperium: Secondary | ICD-10-CM

## 2018-06-03 NOTE — Patient Instructions (Signed)
Vaginal Delivery Vaginal delivery means that you will give birth by pushing your baby out of your birth canal (vagina). A team of health care providers will help you before, during, and after vaginal delivery. Birth experiences are unique for every woman and every pregnancy, and birth experiences vary depending on where you choose to give birth. What should I do to prepare for my baby's birth? Before your baby is born, it is important to talk with your health care provider about:  Your labor and delivery preferences. These may include: ? Medicines that you may be given. ? How you will manage your pain. This might include non-medical pain relief techniques or injectable pain relief such as epidural analgesia. ? How you and your baby will be monitored during labor and delivery. ? Who may be in the labor and delivery room with you. ? Your feelings about surgical delivery of your baby (cesarean delivery, or C-section) if this becomes necessary. ? Your feelings about receiving donated blood through an IV tube (blood transfusion) if this becomes necessary.  Whether you are able: ? To take pictures or videos of the birth. ? To eat during labor and delivery. ? To move around, walk, or change positions during labor and delivery.  What to expect after your baby is born, such as: ? Whether delayed umbilical cord clamping and cutting is offered. ? Who will care for your baby right after birth. ? Medicines or tests that may be recommended for your baby. ? Whether breastfeeding is supported in your hospital or birth center. ? How long you will be in the hospital or birth center.  How any medical conditions you have may affect your baby or your labor and delivery experience.  To prepare for your baby's birth, you should also:  Attend all of your health care visits before delivery (prenatal visits) as recommended by your health care provider. This is important.  Prepare your home for your baby's  arrival. Make sure that you have: ? Diapers. ? Baby clothing. ? Feeding equipment. ? Safe sleeping arrangements for you and your baby.  Install a car seat in your vehicle. Have your car seat checked by a certified car seat installer to make sure that it is installed safely.  Think about who will help you with your new baby at home for at least the first several weeks after delivery.  What can I expect when I arrive at the birth center or hospital? Once you are in labor and have been admitted into the hospital or birth center, your health care provider may:  Review your pregnancy history and any concerns you have.  Insert an IV tube into one of your veins. This is used to give you fluids and medicines.  Check your blood pressure, pulse, temperature, and heart rate (vital signs).  Check whether your bag of water (amniotic sac) has broken (ruptured).  Talk with you about your birth plan and discuss pain control options.  Monitoring Your health care provider may monitor your contractions (uterine monitoring) and your baby's heart rate (fetal monitoring). You may need to be monitored:  Often, but not continuously (intermittently).  All the time or for long periods at a time (continuously). Continuous monitoring may be needed if: ? You are taking certain medicines, such as medicine to relieve pain or make your contractions stronger. ? You have pregnancy or labor complications.  Monitoring may be done by:  Placing a special stethoscope or a handheld monitoring device on your abdomen to   check your baby's heartbeat, and feeling your abdomen for contractions. This method of monitoring does not continuously record your baby's heartbeat or your contractions.  Placing monitors on your abdomen (external monitors) to record your baby's heartbeat and the frequency and length of contractions. You may not have to wear external monitors all the time.  Placing monitors inside of your uterus  (internal monitors) to record your baby's heartbeat and the frequency, length, and strength of your contractions. ? Your health care provider may use internal monitors if he or she needs more information about the strength of your contractions or your baby's heart rate. ? Internal monitors are put in place by passing a thin, flexible wire through your vagina and into your uterus. Depending on the type of monitor, it may remain in your uterus or on your baby's head until birth. ? Your health care provider will discuss the benefits and risks of internal monitoring with you and will ask for your permission before inserting the monitors.  Telemetry. This is a type of continuous monitoring that can be done with external or internal monitors. Instead of having to stay in bed, you are able to move around during telemetry. Ask your health care provider if telemetry is an option for you.  Physical exam Your health care provider may perform a physical exam. This may include:  Checking whether your baby is positioned: ? With the head toward your vagina (head-down). This is most common. ? With the head toward the top of your uterus (head-up or breech). If your baby is in a breech position, your health care provider may try to turn your baby to a head-down position so you can deliver vaginally. If it does not seem that your baby can be born vaginally, your provider may recommend surgery to deliver your baby. In rare cases, you may be able to deliver vaginally if your baby is head-up (breech delivery). ? Lying sideways (transverse). Babies that are lying sideways cannot be delivered vaginally.  Checking your cervix to determine: ? Whether it is thinning out (effacing). ? Whether it is opening up (dilating). ? How low your baby has moved into your birth canal.  What are the three stages of labor and delivery?  Normal labor and delivery is divided into the following three stages: Stage 1  Stage 1 is the  longest stage of labor, and it can last for hours or days. Stage 1 includes: ? Early labor. This is when contractions may be irregular, or regular and mild. Generally, early labor contractions are more than 10 minutes apart. ? Active labor. This is when contractions get longer, more regular, more frequent, and more intense. ? The transition phase. This is when contractions happen very close together, are very intense, and may last longer than during any other part of labor.  Contractions generally feel mild, infrequent, and irregular at first. They get stronger, more frequent (about every 2-3 minutes), and more regular as you progress from early labor through active labor and transition.  Many women progress through stage 1 naturally, but you may need help to continue making progress. If this happens, your health care provider may talk with you about: ? Rupturing your amniotic sac if it has not ruptured yet. ? Giving you medicine to help make your contractions stronger and more frequent.  Stage 1 ends when your cervix is completely dilated to 4 inches (10 cm) and completely effaced. This happens at the end of the transition phase. Stage 2  Once   your cervix is completely effaced and dilated to 4 inches (10 cm), you may start to feel an urge to push. It is common for the body to naturally take a rest before feeling the urge to push, especially if you received an epidural or certain other pain medicines. This rest period may last for up to 1-2 hours, depending on your unique labor experience.  During stage 2, contractions are generally less painful, because pushing helps relieve contraction pain. Instead of contraction pain, you may feel stretching and burning pain, especially when the widest part of your baby's head passes through the vaginal opening (crowning).  Your health care provider will closely monitor your pushing progress and your baby's progress through the vagina during stage 2.  Your  health care provider may massage the area of skin between your vaginal opening and anus (perineum) or apply warm compresses to your perineum. This helps it stretch as the baby's head starts to crown, which can help prevent perineal tearing. ? In some cases, an incision may be made in your perineum (episiotomy) to allow the baby to pass through the vaginal opening. An episiotomy helps to make the opening of the vagina larger to allow more room for the baby to fit through.  It is very important to breathe and focus so your health care provider can control the delivery of your baby's head. Your health care provider may have you decrease the intensity of your pushing, to help prevent perineal tearing.  After delivery of your baby's head, the shoulders and the rest of the body generally deliver very quickly and without difficulty.  Once your baby is delivered, the umbilical cord may be cut right away, or this may be delayed for 1-2 minutes, depending on your baby's health. This may vary among health care providers, hospitals, and birth centers.  If you and your baby are healthy enough, your baby may be placed on your chest or abdomen to help maintain the baby's temperature and to help you bond with each other. Some mothers and babies start breastfeeding at this time. Your health care team will dry your baby and help keep your baby warm during this time.  Your baby may need immediate care if he or she: ? Showed signs of distress during labor. ? Has a medical condition. ? Was born too early (prematurely). ? Had a bowel movement before birth (meconium). ? Shows signs of difficulty transitioning from being inside the uterus to being outside of the uterus. If you are planning to breastfeed, your health care team will help you begin a feeding. Stage 3  The third stage of labor starts immediately after the birth of your baby and ends after you deliver the placenta. The placenta is an organ that develops  during pregnancy to provide oxygen and nutrients to your baby in the womb.  Delivering the placenta may require some pushing, and you may have mild contractions. Breastfeeding can stimulate contractions to help you deliver the placenta.  After the placenta is delivered, your uterus should tighten (contract) and become firm. This helps to stop bleeding in your uterus. To help your uterus contract and to control bleeding, your health care provider may: ? Give you medicine by injection, through an IV tube, by mouth, or through your rectum (rectally). ? Massage your abdomen or perform a vaginal exam to remove any blood clots that are left in your uterus. ? Empty your bladder by placing a thin, flexible tube (catheter) into your bladder. ? Encourage   you to breastfeed your baby. After labor is over, you and your baby will be monitored closely to ensure that you are both healthy until you are ready to go home. Your health care team will teach you how to care for yourself and your baby. This information is not intended to replace advice given to you by your health care provider. Make sure you discuss any questions you have with your health care provider. Document Released: 09/10/2008 Document Revised: 06/21/2016 Document Reviewed: 12/17/2015 Elsevier Interactive Patient Education  2018 [employer]  

## 2018-06-03 NOTE — Progress Notes (Signed)
ROB. BP elevated, denies headaches, dizziness or auroras.  Her feet are swollen with slight pitting.

## 2018-06-03 NOTE — Progress Notes (Signed)
Subjective:  Tiffany Hoover is a 27 y.o. G3P1011 at 75w0dbeing seen today for ongoing prenatal care.  She is currently monitored for the following issues for this high-risk pregnancy and has Supervision of other normal pregnancy, antepartum; History of gestational diabetes in prior pregnancy, currently pregnant; History of gestational hypertension; and Gestational diabetes mellitus (GDM), antepartum on their problem list.  Patient reports general discomforts of pregnancy. denies HA, visual changes or epigastric pain.  Contractions: Irregular. Vag. Bleeding: None.  Movement: Present. Denies leaking of fluid.   The following portions of the patient's history were reviewed and updated as appropriate: allergies, current medications, past family history, past medical history, past social history, past surgical history and problem list. Problem list updated.  Objective:   Vitals:   06/03/18 1523 06/03/18 1524  BP: (!) 145/94 (!) 143/94  Pulse: 91 87  Weight: 223 lb 11.2 oz (101.5 kg)     Fetal Status: Fetal Heart Rate (bpm): 140   Movement: Present     General:  Alert, oriented and cooperative. Patient is in no acute distress.  Skin: Skin is warm and dry. No rash noted.   Cardiovascular: Normal heart rate noted  Respiratory: Normal respiratory effort, no problems with respiration noted  Abdomen: Soft, gravid, appropriate for gestational age. Pain/Pressure: Present     Pelvic:  Cervical exam performed        Extremities: Normal range of motion.  Edema: Mild pitting, slight indentation  Mental Status: Normal mood and affect. Normal behavior. Normal judgment and thought content.   Urinalysis:      Assessment and Plan:  Pregnancy: G3P1011 at 351w0d1. Supervision of other normal pregnancy, antepartum Stable Labor precautions  2. History of gestational hypertension BP 140/90's today with any other S/Sx of PEC. Will check labs S/Sx of PEC reviewed with pt - Protein / creatinine ratio,  urine - CBC - Comp Met (CMET)  3. Diet controlled gestational diabetes mellitus (GDM), antepartum CBG's in goal range U/S 3806 gms  IOL scheduled for 06/10/18  Term labor symptoms and general obstetric precautions including but not limited to vaginal bleeding, contractions, leaking of fluid and fetal movement were reviewed in detail with the patient. Please refer to After Visit Summary for other counseling recommendations.  Return in about 1 week (around 06/10/2018) for OB visit.   ErChancy MilroyMD

## 2018-06-04 LAB — CBC
HEMOGLOBIN: 11.2 g/dL (ref 11.1–15.9)
Hematocrit: 33.2 % — ABNORMAL LOW (ref 34.0–46.6)
MCH: 27.9 pg (ref 26.6–33.0)
MCHC: 33.7 g/dL (ref 31.5–35.7)
MCV: 83 fL (ref 79–97)
Platelets: 175 10*3/uL (ref 150–450)
RBC: 4.01 x10E6/uL (ref 3.77–5.28)
RDW: 16.4 % — ABNORMAL HIGH (ref 12.3–15.4)
WBC: 6.7 10*3/uL (ref 3.4–10.8)

## 2018-06-04 LAB — COMPREHENSIVE METABOLIC PANEL
ALBUMIN: 3.2 g/dL — AB (ref 3.5–5.5)
ALK PHOS: 183 IU/L — AB (ref 39–117)
ALT: 14 IU/L (ref 0–32)
AST: 19 IU/L (ref 0–40)
Albumin/Globulin Ratio: 1.2 (ref 1.2–2.2)
BUN / CREAT RATIO: 11 (ref 9–23)
BUN: 7 mg/dL (ref 6–20)
Bilirubin Total: <0.2 mg/dL (ref 0.0–1.2)
CO2: 18 mmol/L — AB (ref 20–29)
CREATININE: 0.65 mg/dL (ref 0.57–1.00)
Calcium: 8.6 mg/dL — ABNORMAL LOW (ref 8.7–10.2)
Chloride: 105 mmol/L (ref 96–106)
GFR calc Af Amer: 142 mL/min/{1.73_m2} (ref >59)
GFR calc non Af Amer: 123 mL/min/{1.73_m2} (ref >59)
GLUCOSE: 78 mg/dL (ref 65–99)
Globulin, Total: 2.7 g/dL (ref 1.5–4.5)
Potassium: 3.6 mmol/L (ref 3.5–5.2)
Sodium: 136 mmol/L (ref 134–144)
Total Protein: 5.9 g/dL — ABNORMAL LOW (ref 6.0–8.5)

## 2018-06-04 LAB — PROTEIN / CREATININE RATIO, URINE
Creatinine, Urine: 26.2 mg/dL
PROTEIN UR: 10.6 mg/dL
PROTEIN/CREAT RATIO: 405 mg/g{creat} — AB (ref 0–200)

## 2018-06-09 ENCOUNTER — Inpatient Hospital Stay (HOSPITAL_COMMUNITY)
Admission: AD | Admit: 2018-06-09 | Discharge: 2018-06-12 | DRG: 807 | Disposition: A | Discharge status: Home / Self Care | Payer: Medicaid Other | Attending: Family Medicine | Admitting: Family Medicine

## 2018-06-09 ENCOUNTER — Ambulatory Visit (INDEPENDENT_AMBULATORY_CARE_PROVIDER_SITE_OTHER): Payer: Medicaid Other | Admitting: Obstetrics and Gynecology

## 2018-06-09 ENCOUNTER — Encounter (HOSPITAL_COMMUNITY): Payer: Self-pay | Admitting: *Deleted

## 2018-06-09 ENCOUNTER — Encounter: Payer: Self-pay | Admitting: Obstetrics and Gynecology

## 2018-06-09 VITALS — BP 148/99 | HR 88 | Wt 228.7 lb

## 2018-06-09 DIAGNOSIS — O1494 Unspecified pre-eclampsia, complicating childbirth: Secondary | ICD-10-CM | POA: Diagnosis not present

## 2018-06-09 DIAGNOSIS — O24410 Gestational diabetes mellitus in pregnancy, diet controlled: Secondary | ICD-10-CM

## 2018-06-09 DIAGNOSIS — O24420 Gestational diabetes mellitus in childbirth, diet controlled: Secondary | ICD-10-CM | POA: Diagnosis present

## 2018-06-09 DIAGNOSIS — O1490 Unspecified pre-eclampsia, unspecified trimester: Secondary | ICD-10-CM

## 2018-06-09 DIAGNOSIS — Z3480 Encounter for supervision of other normal pregnancy, unspecified trimester: Secondary | ICD-10-CM

## 2018-06-09 DIAGNOSIS — Z8759 Personal history of other complications of pregnancy, childbirth and the puerperium: Secondary | ICD-10-CM

## 2018-06-09 DIAGNOSIS — O24419 Gestational diabetes mellitus in pregnancy, unspecified control: Secondary | ICD-10-CM | POA: Diagnosis present

## 2018-06-09 DIAGNOSIS — O1414 Severe pre-eclampsia complicating childbirth: Principal | ICD-10-CM | POA: Diagnosis present

## 2018-06-09 DIAGNOSIS — O134 Gestational [pregnancy-induced] hypertension without significant proteinuria, complicating childbirth: Secondary | ICD-10-CM | POA: Diagnosis present

## 2018-06-09 DIAGNOSIS — Z3A39 39 weeks gestation of pregnancy: Secondary | ICD-10-CM | POA: Diagnosis not present

## 2018-06-09 HISTORY — DX: Type 2 diabetes mellitus without complications: E11.9

## 2018-06-09 LAB — COMPREHENSIVE METABOLIC PANEL
ALK PHOS: 192 U/L — AB (ref 38–126)
ALT: 15 U/L (ref 0–44)
ANION GAP: 9 (ref 5–15)
AST: 24 U/L (ref 15–41)
Albumin: 2.7 g/dL — ABNORMAL LOW (ref 3.5–5.0)
BUN: <5 mg/dL — ABNORMAL LOW (ref 6–20)
CALCIUM: 8.6 mg/dL — AB (ref 8.9–10.3)
CO2: 18 mmol/L — ABNORMAL LOW (ref 22–32)
Chloride: 106 mmol/L (ref 98–111)
Creatinine, Ser: 0.65 mg/dL (ref 0.44–1.00)
GFR calc non Af Amer: >60 mL/min (ref >60)
Glucose, Bld: 80 mg/dL (ref 70–99)
Potassium: 3.5 mmol/L (ref 3.5–5.1)
Sodium: 133 mmol/L — ABNORMAL LOW (ref 135–145)
Total Bilirubin: 0.6 mg/dL (ref 0.3–1.2)
Total Protein: 6 g/dL — ABNORMAL LOW (ref 6.5–8.1)

## 2018-06-09 LAB — CBC
HCT: 34.3 % — ABNORMAL LOW (ref 36.0–46.0)
Hemoglobin: 11.2 g/dL — ABNORMAL LOW (ref 12.0–15.0)
MCH: 27.9 pg (ref 26.0–34.0)
MCHC: 32.7 g/dL (ref 30.0–36.0)
MCV: 85.3 fL (ref 78.0–100.0)
PLATELETS: 169 10*3/uL (ref 150–400)
RBC: 4.02 MIL/uL (ref 3.87–5.11)
RDW: 16.1 % — ABNORMAL HIGH (ref 11.5–15.5)
WBC: 6.7 10*3/uL (ref 4.0–10.5)

## 2018-06-09 LAB — GLUCOSE, CAPILLARY
Glucose-Capillary: 81 mg/dL (ref 70–99)
Glucose-Capillary: 90 mg/dL (ref 70–99)

## 2018-06-09 LAB — TYPE AND SCREEN
ABO/RH(D): A POS
Antibody Screen: NEGATIVE

## 2018-06-09 LAB — PROTEIN / CREATININE RATIO, URINE
CREATININE, URINE: 64 mg/dL
PROTEIN CREATININE RATIO: 0.8 mg/mg{creat} — AB (ref 0.00–0.15)
TOTAL PROTEIN, URINE: 51 mg/dL

## 2018-06-09 LAB — ABO/RH: ABO/RH(D): A POS

## 2018-06-09 MED ORDER — LIDOCAINE HCL (PF) 1 % IJ SOLN
30.0000 mL | INTRAMUSCULAR | Status: DC | PRN
  Filled 2018-06-09: qty 30

## 2018-06-09 MED ORDER — DIPHENHYDRAMINE HCL 50 MG/ML IJ SOLN: 12.5000 mg | INTRAMUSCULAR | Status: DC | PRN

## 2018-06-09 MED ORDER — ACETAMINOPHEN 500 MG PO TABS
1000.0000 mg | ORAL_TABLET | Freq: Once | ORAL | Status: AC
  Administered 2018-06-09: 1000 mg via ORAL
  Filled 2018-06-09: qty 2

## 2018-06-09 MED ORDER — LACTATED RINGERS IV SOLN
500.0000 mL | Freq: Once | INTRAVENOUS | Status: AC
  Administered 2018-06-10: 500 mL via INTRAVENOUS

## 2018-06-09 MED ORDER — PHENYLEPHRINE 40 MCG/ML (10ML) SYRINGE FOR IV PUSH (FOR BLOOD PRESSURE SUPPORT)
80.0000 ug | PREFILLED_SYRINGE | INTRAVENOUS | Status: DC | PRN
  Filled 2018-06-09: qty 5
  Filled 2018-06-09: qty 10

## 2018-06-09 MED ORDER — OXYTOCIN BOLUS FROM INFUSION
500.0000 mL | Freq: Once | INTRAVENOUS | Status: AC
  Administered 2018-06-10: 500 mL via INTRAVENOUS

## 2018-06-09 MED ORDER — TERBUTALINE SULFATE 1 MG/ML IJ SOLN
0.2500 mg | Freq: Once | INTRAMUSCULAR | Status: DC | PRN
  Filled 2018-06-09: qty 1

## 2018-06-09 MED ORDER — PHENYLEPHRINE 40 MCG/ML (10ML) SYRINGE FOR IV PUSH (FOR BLOOD PRESSURE SUPPORT)
80.0000 ug | PREFILLED_SYRINGE | INTRAVENOUS | Status: DC | PRN
  Filled 2018-06-09: qty 5

## 2018-06-09 MED ORDER — ACETAMINOPHEN 325 MG PO TABS: 650.0000 mg | ORAL_TABLET | ORAL | Status: DC | PRN

## 2018-06-09 MED ORDER — LACTATED RINGERS IV SOLN
INTRAVENOUS | Status: DC
  Administered 2018-06-09 (×2): via INTRAVENOUS

## 2018-06-09 MED ORDER — OXYTOCIN 40 UNITS IN LACTATED RINGERS INFUSION - SIMPLE MED
1.0000 m[IU]/min | INTRAVENOUS | Status: DC
  Administered 2018-06-09: 2 m[IU]/min via INTRAVENOUS

## 2018-06-09 MED ORDER — EPHEDRINE 5 MG/ML INJ
10.0000 mg | INTRAVENOUS | Status: DC | PRN
  Filled 2018-06-09: qty 2

## 2018-06-09 MED ORDER — OXYTOCIN 40 UNITS IN LACTATED RINGERS INFUSION - SIMPLE MED
2.5000 [IU]/h | INTRAVENOUS | Status: DC
  Administered 2018-06-10: 2.5 [IU]/h via INTRAVENOUS
  Filled 2018-06-09: qty 1000

## 2018-06-09 MED ORDER — MISOPROSTOL 25 MCG QUARTER TABLET
25.0000 ug | ORAL_TABLET | ORAL | Status: DC | PRN
  Administered 2018-06-09: 25 ug via VAGINAL
  Filled 2018-06-09 (×2): qty 1

## 2018-06-09 MED ORDER — ONDANSETRON HCL 4 MG/2ML IJ SOLN: 4.0000 mg | Freq: Four times a day (QID) | INTRAMUSCULAR | Status: DC | PRN

## 2018-06-09 MED ORDER — FENTANYL CITRATE (PF) 100 MCG/2ML IJ SOLN
100.0000 ug | INTRAMUSCULAR | Status: DC | PRN
  Administered 2018-06-09: 100 ug via INTRAVENOUS
  Filled 2018-06-09: qty 2

## 2018-06-09 MED ORDER — LACTATED RINGERS IV SOLN: 500.0000 mL | INTRAVENOUS | Status: DC | PRN

## 2018-06-09 MED ORDER — FENTANYL 2.5 MCG/ML BUPIVACAINE 1/10 % EPIDURAL INFUSION (WH - ANES)
14.0000 mL/h | INTRAMUSCULAR | Status: DC | PRN
  Administered 2018-06-10: 14 mL/h via EPIDURAL
  Filled 2018-06-09: qty 100

## 2018-06-09 NOTE — Progress Notes (Addendum)
LABOR PROGRESS NOTE  Tiffany Hoover is a 27 y.o. G3P1011 at 5172w6d  admitted for IOL for PIH.   Subjective: Pt starting to feel some pain  Objective: Vitals:   06/09/18 2100 06/09/18 2128 06/09/18 2245 06/09/18 2300  BP: (!) 148/97  133/81 (!) 160/101  Pulse: 89  82 81  Resp:  18    Temp:      TempSrc:      Weight:      Height:        Dilation: 3.5 Effacement (%): 60 Station: -3 Presentation: Vertex Exam by:: Sandy SalaamLynnsey Johnson, RN   FHT: baseline rate 145, moderate varibility, +acel, no decel Toco: irregular ctx   Assessment / Plan: 27 y.o. G3P1011 at 5972w6d here for IOL for PIH.  Pre-eclampsia: dx with PIH today. UPC 0.8. HELLP labs normal. No signs/sx of severe features. Most recent BP though was 160 systolic, but pt in pain. Recheck BP after pain meds. Continue to monitor closely  Labor: s/p cytotec x1. Start IV Pitocin now Fetal Wellbeing:  Cat I Pain Control:  IV Fentanyl prn. May have epidural Anticipated MOD:  SVD  Frederik PearJulie P Degele, MD 06/09/2018, 11:16 PM

## 2018-06-09 NOTE — Progress Notes (Signed)
   PRENATAL VISIT NOTE  Subjective:  Tiffany Hoover is a 27 y.o. G3P1011 at 7969w6d being seen today for ongoing prenatal care.  She is currently monitored for the following issues for this high-risk pregnancy and has Supervision of other normal pregnancy, antepartum; History of gestational diabetes in prior pregnancy, currently pregnant; History of gestational hypertension; and Gestational diabetes mellitus (GDM), antepartum on their problem list.  Patient reports swelling in feet.  Contractions: Irregular. Vag. Bleeding: None.  Movement: Present. Denies leaking of fluid.   The following portions of the patient's history were reviewed and updated as appropriate: allergies, current medications, past family history, past medical history, past social history, past surgical history and problem list. Problem list updated.  Objective:   Vitals:   06/09/18 1542  BP: (!) 148/99  Pulse: 88  Weight: 228 lb 11.2 oz (103.7 kg)  repeat 158/114  Fetal Status: Fetal Heart Rate (bpm): NST    Movement: Present     General:  Alert, oriented and cooperative. Patient is in no acute distress.  Skin: Skin is warm and dry. No rash noted.   Cardiovascular: Normal heart rate noted  Respiratory: Normal respiratory effort, no problems with respiration noted  Abdomen: Soft, gravid, appropriate for gestational age.  Pain/Pressure: Present     Pelvic: Cervical exam deferred        Extremities: Normal range of motion.  Edema: Very deep pitting, indentation lasts a long time  Mental Status: Normal mood and affect. Normal behavior. Normal judgment and thought content.   Assessment and Plan:  Pregnancy: G3P1011 at 6169w6d  1. Supervision of other normal pregnancy, antepartum  2. Diet controlled gestational diabetes mellitus (GDM), antepartum CBGs well controlled  3. History of gestational hypertension  Patient with severe range diastolic in office, to L&D for direct admit for severe gestational HTN vs pre-eclampsia  with severe features.  Term labor symptoms and general obstetric precautions including but not limited to vaginal bleeding, contractions, leaking of fluid and fetal movement were reviewed in detail with the patient. Please refer to After Visit Summary for other counseling recommendations.  Return in about 1 month (around 07/07/2018) for post partum check.  Future Appointments  Date Time Provider Department Center  06/10/2018  7:30 AM [facility] None    Conan BowensKelly M Jlyn Bracamonte, MD

## 2018-06-09 NOTE — H&P (Signed)
LABOR AND DELIVERY ADMISSION HISTORY AND PHYSICAL NOTE  Tiffany Hoover is a 27 y.o. female G3P1011 with IUP at 39w6dby 5-wk U/S presenting for IOL. She was scheduled for IOL for A1GDM tomorrow, and was going to get a FB placed outpatient today. However, during visit today she was noted to have elevated BP. She has h/o GHTN last pregnancy as well, and was first noted to have mildly elevated BP in the office last [facility] then again today. She endorses headache, but reports that this just started and it is mild. She denies any dizziness/lightheadness, visual disturbances, RUQ/epigastric pain, or SOB.   She reports positive fetal movement. She denies leakage of fluid or vaginal bleeding.  Prenatal History/Complications: PNC at [facility] Pregnancy complications:  - GDM, diet controlled - Hx of GHTN  Past Medical History: Past Medical History:  Diagnosis Date  . Hypertension     Past Surgical History: Past Surgical History:  Procedure Laterality Date  . NO PAST SURGERIES      Obstetrical History: OB History    Gravida  3   Para  1   Term  1   Preterm      AB  1   Living  1     SAB      TAB  1   Ectopic      Multiple      Live Births  1           Social History: Social History   Socioeconomic History  . Marital status: Single    Spouse name: Not on file  . Number of children: Not on file  . Years of education: Not on file  . Highest education level: Not on file  Occupational History  . Not on file  Social Needs  . Financial resource strain: Not on file  . Food insecurity:    Worry: Not on file    Inability: Not on file  . Transportation needs:    Medical: Not on file    Non-medical: Not on file  Tobacco Use  . Smoking status: Never Smoker  . Smokeless tobacco: Never Used  Substance and Sexual Activity  . Alcohol use: No  . Drug use: No  . Sexual activity: Yes    Birth control/protection: None  Lifestyle  . Physical  activity:    Days per week: Not on file    Minutes per session: Not on file  . Stress: Not on file  Relationships  . Social connections:    Talks on phone: Not on file    Gets together: Not on file    Attends religious service: Not on file    Active member of club or organization: Not on file    Attends meetings of clubs or organizations: Not on file    Relationship status: Not on file  Other Topics Concern  . Not on file  Social History Narrative  . Not on file    Family History: Family History  Problem Relation Age of Onset  . Diabetes Mother   . Hypertension Mother   . Hypertension Sister   . Diabetes Maternal Aunt   . Hypertension Maternal Aunt     Allergies: No Known Allergies  Medications Prior to Admission  Medication Sig Dispense Refill Last Dose  . ACCU-CHEK FASTCLIX LANCETS MISC 100 Containers by Percutaneous route 4 (four) times daily. Check sugars AM Fasting and 2 hours after each meal. 100 each 12 Taking  . aspirin  EC 81 MG tablet Take 1 tablet (81 mg total) by mouth daily. Take after 12 weeks for prevention of preeclampsia later in pregnancy (Patient not taking: Reported on 06/09/2018) 300 tablet 2 Not Taking  . Blood Glucose Monitoring Suppl (ACCU-CHEK GUIDE) w/Device KIT 1 Device by Does not apply route QID. 1 kit 0 Taking  . Elastic Bandages & Supports (COMFORT FIT MATERNITY SUPP MED) MISC Wear daily when ambulating (Patient not taking: Reported on 05/20/2018) 1 each 0 Not Taking  . glucose blood test strip Use as instructed. Check sugars AM Fasting, and 2 hours after every meal. 100 each 12 Taking  . Prenatal Vit-Fe Fumarate-FA (PREPLUS) 27-1 MG TABS Take 1 tablet by mouth daily. 30 tablet 13 Taking     Review of Systems  All systems reviewed and negative except as stated in HPI  Physical Exam Blood pressure (!) 155/95, pulse 80, temperature 98.6 F (37 C), temperature source Oral, resp. rate 18, height '5\' 4"'  (1.626 m), weight 228 lb 11.2 oz (103.7 kg),  last menstrual period 09/11/2017, unknown if currently breastfeeding. General appearance: alert, oriented, NAD Lungs: normal respiratory effort; lungs CTAB Heart: regular rate, normal rythm Abdomen: soft, non-tender; gravid, FH appropriate for GA Extremities: 2+ BLE edema Neuro: Cns intact. Patellar DTRs 2+ bilaterally. No clonus  SVE: Dilation: 3 Effacement (%): 30 Station: -3 Exam by:: Dr. Lambert Keto  Presentation: cephalic Fetal monitoring: baseline rate 145, moderate variability, +acel, no decel Uterine activity: occasional ctx  Prenatal labs: ABO, Rh: A/Positive/-- (12/12 1634) Antibody: Negative (12/12 1634) Rubella: 2.47 (12/12 1634) RPR: Non Reactive (12/12 1634)  HBsAg: Negative (12/12 1634)  HIV: Non Reactive (12/12 1634)  GC/Chlamydia: negative GBS: Negative (05/29 1655)  2-hr GTT: 93, 159, 101 Genetic screening:  Negative Quad screen Anatomy US: normal fetal anatomy U/S at [redacted]w[redacted]d EFW > 90th %tile; AC > 97th %tile; 3806 grams; 8+6  Prenatal Transfer Tool  Maternal Diabetes: Yes:  Diabetes Type:  Diet controlled Genetic Screening: Normal Maternal Ultrasounds/Referrals: Normal Fetal Ultrasounds or other Referrals:  None Maternal Substance Abuse:  No Significant Maternal Medications:  None Significant Maternal Lab Results: Lab values include: Group B Strep negative  No results found for this or any previous visit (from the past 24 hour(s)).  Patient Active Problem List   Diagnosis Date Noted  . Gestational diabetes mellitus (GDM), antepartum 02/25/2018  . Supervision of other normal pregnancy, antepartum 11/26/2017  . History of gestational diabetes in prior pregnancy, currently pregnant 11/26/2017  . History of gestational hypertension 11/26/2017    Assessment: Tiffany Roundtreeis a 27y.o. G3P1011 at 351w6dere for IOL f/t new onset GHTN. Pregnancy also complicated by A1C3JSE #PIH: BP mild range. Has mild headache that just started; no other symptoms  Admit to  IOL  Check CBC, CMP, UPC  Monitor BPs  Treat headache with Tylenol and monitor. If persistent headache, may need to start IV Mag. Monitor for other signs/symptoms of preeclampsia with features  #GDM: diet controlled  Check CBGs q4h  Shoulder precautions  #IOL: Cytotec for cervical ripening #FWB: Cat I #ID:  GBS negative #MOF: Breast #MOC:Depo #Circ:  N/a, girl  JuJenne Paneegele 06/09/2018, 4:34 PM

## 2018-06-09 NOTE — Progress Notes (Signed)
Pt presents for foley bulb and ROB c/o slight HA's and swelling in feet and hands.

## 2018-06-10 ENCOUNTER — Encounter (HOSPITAL_COMMUNITY): Payer: Self-pay

## 2018-06-10 ENCOUNTER — Inpatient Hospital Stay (HOSPITAL_COMMUNITY): Payer: Medicaid Other | Admitting: Anesthesiology

## 2018-06-10 ENCOUNTER — Other Ambulatory Visit: Payer: Self-pay

## 2018-06-10 ENCOUNTER — Inpatient Hospital Stay (HOSPITAL_COMMUNITY): Admission: RE | Admit: 2018-06-10 | Payer: Medicaid Other | Source: Ambulatory Visit

## 2018-06-10 DIAGNOSIS — Z3A39 39 weeks gestation of pregnancy: Secondary | ICD-10-CM

## 2018-06-10 DIAGNOSIS — O24420 Gestational diabetes mellitus in childbirth, diet controlled: Secondary | ICD-10-CM

## 2018-06-10 DIAGNOSIS — O1490 Unspecified pre-eclampsia, unspecified trimester: Secondary | ICD-10-CM

## 2018-06-10 DIAGNOSIS — O1494 Unspecified pre-eclampsia, complicating childbirth: Secondary | ICD-10-CM

## 2018-06-10 HISTORY — DX: Unspecified pre-eclampsia, unspecified trimester: O14.90

## 2018-06-10 LAB — COMPREHENSIVE METABOLIC PANEL
ALT: 15 U/L (ref 0–44)
ANION GAP: 10 (ref 5–15)
AST: 28 U/L (ref 15–41)
Albumin: 2.6 g/dL — ABNORMAL LOW (ref 3.5–5.0)
Alkaline Phosphatase: 181 U/L — ABNORMAL HIGH (ref 38–126)
BUN: 6 mg/dL (ref 6–20)
CHLORIDE: 103 mmol/L (ref 98–111)
CO2: 20 mmol/L — ABNORMAL LOW (ref 22–32)
Calcium: 8.6 mg/dL — ABNORMAL LOW (ref 8.9–10.3)
Creatinine, Ser: 0.69 mg/dL (ref 0.44–1.00)
GFR calc Af Amer: >60 mL/min (ref >60)
GFR calc non Af Amer: >60 mL/min (ref >60)
GLUCOSE: 99 mg/dL (ref 70–99)
POTASSIUM: 3.5 mmol/L (ref 3.5–5.1)
SODIUM: 133 mmol/L — AB (ref 135–145)
Total Bilirubin: 0.3 mg/dL (ref 0.3–1.2)
Total Protein: 6.2 g/dL — ABNORMAL LOW (ref 6.5–8.1)

## 2018-06-10 LAB — CBC
HCT: 38.1 % (ref 36.0–46.0)
HEMATOCRIT: 36.8 % (ref 36.0–46.0)
HEMOGLOBIN: 12.1 g/dL (ref 12.0–15.0)
Hemoglobin: 12.4 g/dL (ref 12.0–15.0)
MCH: 28.2 pg (ref 26.0–34.0)
MCH: 28.4 pg (ref 26.0–34.0)
MCHC: 32.5 g/dL (ref 30.0–36.0)
MCHC: 32.9 g/dL (ref 30.0–36.0)
MCV: 86.8 fL (ref 78.0–100.0)
MCV: 86.4 fL (ref 78.0–100.0)
PLATELETS: 169 10*3/uL (ref 150–400)
Platelets: 173 10*3/uL (ref 150–400)
RBC: 4.39 MIL/uL (ref 3.87–5.11)
RBC: 4.26 MIL/uL (ref 3.87–5.11)
RDW: 16.4 % — ABNORMAL HIGH (ref 11.5–15.5)
RDW: 16.3 % — ABNORMAL HIGH (ref 11.5–15.5)
WBC: 8.3 10*3/uL (ref 4.0–10.5)
WBC: 10.9 10*3/uL — ABNORMAL HIGH (ref 4.0–10.5)

## 2018-06-10 LAB — RPR: RPR Ser Ql: NONREACTIVE

## 2018-06-10 LAB — GLUCOSE, CAPILLARY: GLUCOSE-CAPILLARY: 95 mg/dL (ref 70–99)

## 2018-06-10 MED ORDER — COCONUT OIL OIL
1.0000 | TOPICAL_OIL | Status: DC | PRN
Start: 2018-06-10 — End: 2018-06-12

## 2018-06-10 MED ORDER — TETANUS-DIPHTH-ACELL PERTUSSIS 5-2.5-18.5 LF-MCG/0.5 IM SUSP: 0.5000 mL | Freq: Once | INTRAMUSCULAR | Status: DC

## 2018-06-10 MED ORDER — PRENATAL MULTIVITAMIN CH
1.0000 | ORAL_TABLET | Freq: Every day | ORAL | Status: DC
  Administered 2018-06-10 – 2018-06-12 (×3): 1 via ORAL
  Filled 2018-06-10 (×3): qty 1

## 2018-06-10 MED ORDER — SENNOSIDES-DOCUSATE SODIUM 8.6-50 MG PO TABS
2.0000 | ORAL_TABLET | ORAL | Status: DC
  Administered 2018-06-10 – 2018-06-11 (×2): 2 via ORAL
  Filled 2018-06-10 (×2): qty 2

## 2018-06-10 MED ORDER — HYDRALAZINE HCL 20 MG/ML IJ SOLN: 10.0000 mg | Freq: Once | INTRAMUSCULAR | Status: DC | PRN

## 2018-06-10 MED ORDER — MISOPROSTOL 200 MCG PO TABS
ORAL_TABLET | ORAL | Status: AC
  Filled 2018-06-10: qty 1

## 2018-06-10 MED ORDER — IBUPROFEN 600 MG PO TABS
600.0000 mg | ORAL_TABLET | Freq: Four times a day (QID) | ORAL | Status: DC
  Administered 2018-06-10 – 2018-06-12 (×10): 600 mg via ORAL
  Filled 2018-06-10 (×10): qty 1

## 2018-06-10 MED ORDER — BENZOCAINE-MENTHOL 20-0.5 % EX AERO: 1.0000 "application " | INHALATION_SPRAY | CUTANEOUS | Status: DC | PRN

## 2018-06-10 MED ORDER — DIPHENHYDRAMINE HCL 25 MG PO CAPS: 25.0000 mg | ORAL_CAPSULE | Freq: Four times a day (QID) | ORAL | Status: DC | PRN

## 2018-06-10 MED ORDER — ONDANSETRON HCL 4 MG/2ML IJ SOLN: 4.0000 mg | INTRAMUSCULAR | Status: DC | PRN

## 2018-06-10 MED ORDER — ONDANSETRON HCL 4 MG PO TABS: 4.0000 mg | ORAL_TABLET | ORAL | Status: DC | PRN

## 2018-06-10 MED ORDER — ZOLPIDEM TARTRATE 5 MG PO TABS: 5.0000 mg | ORAL_TABLET | Freq: Every evening | ORAL | Status: DC | PRN

## 2018-06-10 MED ORDER — PHENYLEPHRINE 40 MCG/ML (10ML) SYRINGE FOR IV PUSH (FOR BLOOD PRESSURE SUPPORT)
80.0000 ug | PREFILLED_SYRINGE | INTRAVENOUS | Status: DC | PRN
  Filled 2018-06-10: qty 5

## 2018-06-10 MED ORDER — MISOPROSTOL 200 MCG PO TABS
400.0000 ug | ORAL_TABLET | Freq: Once | ORAL | Status: AC
  Administered 2018-06-10: 400 ug via ORAL

## 2018-06-10 MED ORDER — WITCH HAZEL-GLYCERIN EX PADS: 1.0000 "application " | MEDICATED_PAD | CUTANEOUS | Status: DC | PRN

## 2018-06-10 MED ORDER — LABETALOL HCL 5 MG/ML IV SOLN
20.0000 mg | INTRAVENOUS | Status: DC | PRN
  Administered 2018-06-10 (×2): 20 mg via INTRAVENOUS
  Filled 2018-06-10 (×2): qty 4

## 2018-06-10 MED ORDER — ACETAMINOPHEN 325 MG PO TABS
650.0000 mg | ORAL_TABLET | ORAL | Status: DC | PRN
  Administered 2018-06-10 – 2018-06-11 (×2): 650 mg via ORAL
  Filled 2018-06-10 (×2): qty 2

## 2018-06-10 MED ORDER — LACTATED RINGERS IV SOLN
INTRAVENOUS | Status: DC
  Administered 2018-06-10 – 2018-06-11 (×2): via INTRAVENOUS

## 2018-06-10 MED ORDER — LIDOCAINE HCL (PF) 1 % IJ SOLN
INTRAMUSCULAR | Status: DC | PRN
  Administered 2018-06-10: 5 mL via EPIDURAL

## 2018-06-10 MED ORDER — DIBUCAINE 1 % RE OINT: 1.0000 "application " | TOPICAL_OINTMENT | RECTAL | Status: DC | PRN

## 2018-06-10 MED ORDER — MAGNESIUM SULFATE 40 G IN LACTATED RINGERS - SIMPLE
2.0000 g/h | INTRAVENOUS | Status: DC
  Administered 2018-06-10: 2 g/h via INTRAVENOUS
  Filled 2018-06-10: qty 40
  Filled 2018-06-10: qty 500

## 2018-06-10 MED ORDER — NIFEDIPINE 10 MG PO CAPS
10.0000 mg | ORAL_CAPSULE | Freq: Three times a day (TID) | ORAL | Status: DC
  Administered 2018-06-10 – 2018-06-11 (×4): 10 mg via ORAL
  Filled 2018-06-10 (×4): qty 1

## 2018-06-10 MED ORDER — SIMETHICONE 80 MG PO CHEW: 80.0000 mg | CHEWABLE_TABLET | ORAL | Status: DC | PRN

## 2018-06-10 MED ORDER — MAGNESIUM SULFATE 40 G IN LACTATED RINGERS - SIMPLE: 2.0000 g/h | INTRAVENOUS | Status: DC

## 2018-06-10 MED ORDER — EPHEDRINE 5 MG/ML INJ
10.0000 mg | INTRAVENOUS | Status: DC | PRN
  Filled 2018-06-10: qty 2

## 2018-06-10 MED ORDER — LACTATED RINGERS IV SOLN: 500.0000 mL | Freq: Once | INTRAVENOUS | Status: DC

## 2018-06-10 MED ORDER — MAGNESIUM SULFATE BOLUS VIA INFUSION
4.0000 g | Freq: Once | INTRAVENOUS | Status: AC
  Administered 2018-06-10: 4 g via INTRAVENOUS
  Filled 2018-06-10: qty 500

## 2018-06-10 MED ORDER — MISOPROSTOL 200 MCG PO TABS
600.0000 ug | ORAL_TABLET | Freq: Once | ORAL | Status: AC
  Administered 2018-06-10: 600 ug via RECTAL

## 2018-06-10 NOTE — Anesthesia Procedure Notes (Signed)
Epidural Patient location during procedure: OB Start time: 06/10/2018 1:05 AM End time: 06/10/2018 1:11 AM  Staffing Anesthesiologist: Shelton SilvasHollis, Jaman Aro D, MD Performed: anesthesiologist   Preanesthetic Checklist Completed: patient identified, site marked, surgical consent, pre-op evaluation, timeout performed, IV checked, risks and benefits discussed and monitors and equipment checked  Epidural Patient position: sitting Prep: ChloraPrep Patient monitoring: heart rate, continuous pulse ox and blood pressure Approach: midline Location: L3-L4 Injection technique: LOR saline  Needle:  Needle type: Tuohy  Needle gauge: 17 G Needle length: 9 cm Catheter type: closed end flexible Catheter size: 20 Guage Test dose: negative and 1.5% lidocaine  Assessment Events: blood not aspirated, injection not painful, no injection resistance and no paresthesia  Additional Notes LOR @ 6  Patient identified. Risks/Benefits/Options discussed with patient including but not limited to bleeding, infection, nerve damage, paralysis, failed block, incomplete pain control, headache, blood pressure changes, nausea, vomiting, reactions to medications, itching and postpartum back pain. Confirmed with bedside nurse the patient's most recent platelet count. Confirmed with patient that they are not currently taking any anticoagulation, have any bleeding history or any family history of bleeding disorders. Patient expressed understanding and wished to proceed. All questions were answered. Sterile technique was used throughout the entire procedure. Please see nursing notes for vital signs. Test dose was given through epidural catheter and negative prior to continuing to dose epidural or start infusion. Warning signs of high block given to the patient including shortness of breath, tingling/numbness in hands, complete motor block, or any concerning symptoms with instructions to call for help. Patient was given instructions on  fall risk and not to get out of bed. All questions and concerns addressed with instructions to call with any issues or inadequate analgesia.    Reason for block:procedure for pain

## 2018-06-10 NOTE — Anesthesia Preprocedure Evaluation (Signed)
Anesthesia Evaluation  Patient identified by MRN, date of birth, ID band Patient awake    Reviewed: Allergy & Precautions, Patient's Chart, lab work & pertinent test results  Airway Mallampati: II       Dental   Pulmonary    Pulmonary exam normal        Cardiovascular hypertension, Normal cardiovascular exam     Neuro/Psych negative neurological ROS     GI/Hepatic Neg liver ROS,   Endo/Other  diabetes  Renal/GU      Musculoskeletal   Abdominal   Peds  Hematology negative hematology ROS (+)   Anesthesia Other Findings   Reproductive/Obstetrics                             Anesthesia Physical Anesthesia Plan  ASA: III  Anesthesia Plan: Epidural   Post-op Pain Management:    Induction:   PONV Risk Score and Plan:   Airway Management Planned:   Additional Equipment:   Intra-op Plan:   Post-operative Plan:   Informed Consent: I have reviewed the patients History and Physical, chart, labs and discussed the procedure including the risks, benefits and alternatives for the proposed anesthesia with the patient or authorized representative who has indicated his/her understanding and acceptance.     Plan Discussed with:   Anesthesia Plan Comments: (Lab Results      Component                Value               Date                      WBC                      6.7                 06/09/2018                HGB                      11.2 (L)            06/09/2018                HCT                      34.3 (L)            06/09/2018                MCV                      85.3                06/09/2018                PLT                      169                 06/09/2018           )        Anesthesia Quick Evaluation

## 2018-06-10 NOTE — Progress Notes (Addendum)
Pt with several blood pressures in the severe range after epidural placement. Blood pressures have been steady in the 130-155 range but no treatment needed. Before epidural placement pain was 10/10 and pt was unable to sit still after placement due to pain. Spoke with Dr. Nira Retortegele and we will keep monitoring her BP once she is comfortable.

## 2018-06-10 NOTE — Progress Notes (Signed)
Dr. Nira Retortegele notified of bp MD to see pt

## 2018-06-10 NOTE — Plan of Care (Signed)
  Problem: Education: Goal: Knowledge of condition will improve Outcome: Progressing   

## 2018-06-10 NOTE — Progress Notes (Signed)
Update Note:  S: Notified by RN that patient has severe range BP. In to evaluate. Pt also reports headache. Denies other symptoms.  O:  Patient Vitals for the past 4 hrs:  BP Temp Temp src Pulse Resp SpO2  06/10/18 0346 (!) 169/89 - - - - -  06/10/18 0345 (!) 185/98 99.2 F (37.3 C) - 72 18 100 %  06/10/18 0247 (!) 145/83 - - 74 - -  06/10/18 0232 (!) 151/98 - - 79 - -  06/10/18 0216 (!) 154/103 - - 83 - -  06/10/18 0201 (!) 151/97 - - 84 - -  06/10/18 0158 (!) 147/77 - - 74 - -  06/10/18 0156 (!) 156/84 - - 70 - -  06/10/18 0150 - - - - - 99 %  06/10/18 0141 (!) 151/102 - - 78 - -  06/10/18 0140 - - - - - 99 %  06/10/18 0137 - - - - - 98 %  06/10/18 0136 (!) 156/102 - - 68 - -  06/10/18 0135 (!) 156/102 - - 68 - -  06/10/18 0131 (!) 153/93 - - 70 - -  06/10/18 0130 (!) 153/93 - - 70 - 97 %  06/10/18 0126 (!) 168/106 - - 76 - -  06/10/18 0125 - - - - - 100 %  06/10/18 0121 (!) 149/99 - - 79 - -  06/10/18 0120 - - - - - 100 %  06/10/18 0118 (!) 153/100 - - 73 - -  06/10/18 0116 (!) 168/116 - - 90 - -  06/10/18 0115 (!) 168/116 - - 90 - 100 %  06/10/18 0110 - - - - - 99 %  06/10/18 0108 (!) 162/110 - - 72 - -  06/10/18 0105 - - - - - 99 %  06/10/18 0100 - - - - - 99 %  06/10/18 0030 (!) 153/101 - - 86 - -  06/10/18 0027 - 98.4 F (36.9 C) Oral - 18 -   A/P: Pt with pre-eclampsia. Now about 2 hours postpartum. Had a couple of severe range BP towards end of labor, which improved w/o treatment. Pt now about 2 hours postpartum, and severe range BP again. Also having headache.   - Start IV Magnesium for pre-eclampsia now with severe features - IV labetalol to treat severe range BPs - Repeat CBC and CMP - Transfer to 3rd floor  Plan discussed with patient  Kandra NicolasJulie P. Daisia Slomski, MD OB Fellow

## 2018-06-10 NOTE — Anesthesia Postprocedure Evaluation (Signed)
Anesthesia Post Note  Patient: Burnadette PeterEbony Moultry  Procedure(s) Performed: AN AD HOC LABOR EPIDURAL     Patient location during evaluation: [facility] Anesthesia Type: Epidural Level of consciousness: awake and alert Pain management: pain level controlled Vital Signs Assessment: post-procedure vital signs reviewed and stable Respiratory status: spontaneous breathing, nonlabored ventilation and respiratory function stable Cardiovascular status: stable Postop Assessment: no headache, no backache, epidural receding, patient able to bend at knees, adequate PO intake, no apparent nausea or vomiting and able to ambulate Anesthetic complications: no    Last Vitals:  Vitals:   06/10/18 0752 06/10/18 0804  BP: (!) 146/81 (!) 152/75  Pulse: 100 (!) 105  Resp: 18   Temp: 37.3 C   SpO2: 96%     Last Pain:  Vitals:   06/10/18 0804  TempSrc:   PainSc: 2    Pain Goal: Patients Stated Pain Goal: 3 (06/10/18 0607)               Laban EmperorMalinova,Zoa Dowty Hristova

## 2018-06-10 NOTE — Lactation Note (Signed)
This note was copied from a baby's chart. Lactation Consultation Note  Patient Name: Tiffany Hoover   Initial visit at 10 hours of life. Mom is a P1 who only nursed for 1 week with her 1st child b/c "baby wouldn't latch." Mom feels like this infant is doing well at the breast. This infant already noted to be sucking on a pacifier. I recommended not using a pacifier at this time.  Mom has WIC, but does not have a pump at home.   I gave lactation # to Mom in case she'd like me to come & assess latch.  Mom was made aware of O/P services, breastfeeding support groups, community resources, and our phone # for post-discharge questions.   [name], [name] [facility] Hoover, 12:22 PM

## 2018-06-11 MED ORDER — AMLODIPINE BESYLATE 5 MG PO TABS
5.0000 mg | ORAL_TABLET | Freq: Every day | ORAL | Status: DC
Start: 2018-06-11 — End: 2018-06-12
  Administered 2018-06-11: 5 mg via ORAL
  Filled 2018-06-11: qty 1

## 2018-06-11 NOTE — Lactation Note (Signed)
This note was copied from a baby's chart. Lactation Consultation Note  Patient Name: Tiffany Hoover ZOXWR'UToday's Date: 06/11/2018   RN reports mom has changed to formula feeding.      Maternal Data    Feeding Feeding Type: Bottle Fed - Formula  LATCH Score                   Interventions    Lactation Tools Discussed/Used     Consult Status      [name] 06/11/2018, 10:40 AM

## 2018-06-11 NOTE — Progress Notes (Signed)
Post Partum Day 1 Subjective: Patient reports feeling well. She denies HA, visual changes, RUQ/epigastric pain, nausea or emesis. She is ambulating and voiding without difficulty  Objective: Blood pressure 132/86, pulse 98, temperature 97.8 F (36.6 C), temperature source Oral, resp. rate 18, height 5\' 4"  (1.626 m), weight 228 lb 11.2 oz (103.7 kg), last menstrual period 09/11/2017, SpO2 100 %, unknown if currently breastfeeding.  Physical Exam:  General: alert, cooperative and no distress Lochia: appropriate Uterine Fundus: firm DVT Evaluation: No evidence of DVT seen on physical exam.  Recent Labs    06/10/18 0314 06/10/18 0528  HGB 12.4 12.1  HCT 38.1 36.8    Assessment/Plan: 27 yo G3P2012 PPD#1 s/p SVD complicated by preeclampsia - Patient completed magnesium sulfate for seizure prophylaxis - Continue Norvasc - Continue monitoring BP - Continue routine postpartum care    LOS: 2 days   Amyrie Illingworth 06/11/2018, 8:48 AM

## 2018-06-12 MED ORDER — AMLODIPINE BESYLATE 10 MG PO TABS: 10.0000 mg | ORAL_TABLET | Freq: Every day | ORAL | 0 refills | Status: DC

## 2018-06-12 MED ORDER — AMLODIPINE BESYLATE 10 MG PO TABS
10.0000 mg | ORAL_TABLET | Freq: Every day | ORAL | Status: DC
  Administered 2018-06-12: 10 mg via ORAL
  Filled 2018-06-12: qty 1

## 2018-06-12 MED ORDER — IBUPROFEN 600 MG PO TABS: 600.0000 mg | ORAL_TABLET | Freq: Four times a day (QID) | ORAL | 0 refills | Status: DC

## 2018-06-12 MED ORDER — LABETALOL HCL 5 MG/ML IV SOLN
20.0000 mg | Freq: Once | INTRAVENOUS | Status: AC
  Administered 2018-06-12: 20 mg via INTRAVENOUS
  Filled 2018-06-12: qty 4

## 2018-06-12 NOTE — Discharge Summary (Signed)
OB Discharge Summary     Patient Name: Tiffany Hoover DOB: 1991-02-09 MRN: [id number]  Date of admission: 06/09/2018 Delivering MD: Gailen Shelter   Date of discharge: 06/12/2018  Admitting diagnosis: INDUCTION Intrauterine pregnancy: [redacted]w[redacted]d    Secondary diagnosis:  Principal Problem:   Preeclampsia Active Problems:   Gestational diabetes mellitus   SVD (spontaneous vaginal delivery)   Shoulder dystocia during labor and delivery, delivered     Discharge diagnosis: Term Pregnancy Delivered and Preeclampsia (severe)                                                                                                Post partum procedures: magnesium sulfate for seizure prophylaxis  Hospital course:  Induction of Labor With Vaginal Delivery   27y.o. yo [id number] admitted to the hospital 06/09/2018 for induction of labor.  Indication for induction: Gestational hypertension.  Patient had an uncomplicated labor course as follows: Membrane Rupture Time/Date: 1:50 AM ,06/10/2018   Intrapartum Procedures: Episiotomy: None [1]                                         Lacerations:  None [1]  Patient had delivery of a Viable infant complicated by shoulder dystocia Information for the patient's newborn:  BeSoleia, Badolato0[[id number]]Delivery Method: Vag-Spont   06/10/2018  Details of delivery can be found in separate delivery note.  Patient had a routine postpartum course. She received magnesium sulfate for seizure prophylaxis for 24 hours following delivery. She was started on Norvasc for BP control. Patient remained asymptomatic. Patient is discharged home 06/12/18. She will follow up next week for BP check   Physical exam  Vitals:   06/12/18 0006 06/12/18 0322 06/12/18 0809 06/12/18 0835  BP: (!) 143/89 (!) 155/109 (!) 155/102 (!) 151/99  Pulse: 79 80 77 86  Resp: '19 19 18   ' Temp: 97.7 F (36.5 C) 98.7 F (37.1 C) 98.3 F (36.8 C)   TempSrc: Oral Oral Oral   SpO2: 100% 99%  100%   Weight:      Height:       General: alert, cooperative and no distress Lochia: appropriate Uterine Fundus: firm DVT Evaluation: No evidence of DVT seen on physical exam. Labs: Lab Results  Component Value Date   WBC 10.9 (H) 06/10/2018   HGB 12.1 06/10/2018   HCT 36.8 06/10/2018   MCV 86.4 06/10/2018   PLT 173 06/10/2018   CMP Latest Ref Rng & Units 06/10/2018  Glucose 70 - 99 mg/dL 99  BUN 6 - 20 mg/dL 6  Creatinine 0.44 - 1.00 mg/dL 0.69  Sodium 135 - 145 mmol/L 133(L)  Potassium 3.5 - 5.1 mmol/L 3.5  Chloride 98 - 111 mmol/L 103  CO2 22 - 32 mmol/L 20(L)  Calcium 8.9 - 10.3 mg/dL 8.6(L)  Total Protein 6.5 - 8.1 g/dL 6.2(L)  Total Bilirubin 0.3 - 1.2 mg/dL 0.3  Alkaline Phos 38 - 126 U/L 181(H)  AST 15 -  41 U/L 28  ALT 0 - 44 U/L 15    Discharge instruction: per After Visit Summary and "Baby and Me Booklet".  After visit meds:  Allergies as of 06/12/2018   No Known Allergies     Medication List    STOP taking these medications   ACCU-CHEK FASTCLIX LANCETS Misc   ACCU-CHEK GUIDE w/Device Kit   COMFORT FIT MATERNITY SUPP MED Misc   glucose blood test strip     TAKE these medications   amLODipine 10 MG tablet Commonly known as:  NORVASC Take 1 tablet (10 mg total) by mouth daily. Start taking on:  06/13/2018   ibuprofen 600 MG tablet Commonly known as:  ADVIL,MOTRIN Take 1 tablet (600 mg total) by mouth every 6 (six) hours.   PREPLUS 27-1 MG Tabs Take 1 tablet by mouth daily.       Diet: carb modified diet  Activity: Advance as tolerated. Pelvic rest for 6 weeks.   Outpatient follow up: 1 week for BP check Follow up Appt: Future Appointments  Date Time Provider [place]  07/09/2018  1:00 PM Sloan Leiter, MD [place] None   Follow up Visit:No follow-ups on file.  Postpartum contraception: Undecided  Newborn Data: Live born female  Birth Weight: 9 lb 3.3 oz (4176 g) APGAR: 5, 8  Newborn Delivery   Birth date/time:   06/10/2018 01:54:00 Delivery type:  Vaginal, Spontaneous     Baby Feeding: Bottle Disposition:home with mother   06/12/2018 Mora Bellman, MD

## 2018-06-12 NOTE — Discharge Instructions (Signed)
Hypertension During Pregnancy °Hypertension, commonly called high blood pressure, is when the force of blood pumping through your arteries is too strong. Arteries are blood vessels that carry blood from the heart throughout the body. Hypertension during pregnancy can cause problems for you and your baby. Your baby may be born early (prematurely) or may not weigh as much as he or she should at birth. Very bad cases of hypertension during pregnancy can be life-threatening. °Different types of hypertension can occur during pregnancy. These include: °· Chronic hypertension. This happens when: °? You have hypertension before pregnancy and it continues during pregnancy. °? You develop hypertension before you are [redacted] weeks pregnant, and it continues during pregnancy. °· Gestational hypertension. This is hypertension that develops after the 20th week of pregnancy. °· Preeclampsia, also called toxemia of pregnancy. This is a very serious type of hypertension that develops only during pregnancy. It affects the whole body, and it can be very dangerous for you and your baby. ° °Gestational hypertension and preeclampsia usually go away within 6 weeks after your baby is born. Women who have hypertension during pregnancy have a greater chance of developing hypertension later in life or during future pregnancies. °What are the causes? °The exact cause of hypertension is not known. °What increases the risk? °There are certain factors that make it more likely for you to develop hypertension during pregnancy. These include: °· Having hypertension during a previous pregnancy or prior to pregnancy. °· Being overweight. °· Being older than age 40. °· Being pregnant for the first time or being pregnant with more than one baby. °· Becoming pregnant using fertilization methods such as IVF (in vitro fertilization). °· Having diabetes, kidney problems, or systemic lupus erythematosus. °· Having a family history of hypertension. ° °What are the  signs or symptoms? °Chronic hypertension and gestational hypertension rarely cause symptoms. Preeclampsia causes symptoms, which may include: °· Increased protein in your urine. Your health care provider will check for this at every visit before you give birth (prenatal visit). °· Severe headaches. °· Sudden weight gain. °· Swelling of the hands, face, legs, and feet. °· Nausea and vomiting. °· Vision problems, such as blurred or double vision. °· Numbness in the face, arms, legs, and feet. °· Dizziness. °· Slurred speech. °· Sensitivity to bright lights. °· Abdominal pain. °· Convulsions. ° °How is this diagnosed? °You may be diagnosed with hypertension during a routine prenatal exam. At each prenatal visit, you may: °· Have a urine test to check for high amounts of protein in your urine. °· Have your blood pressure checked. A blood pressure reading is recorded as two numbers, such as "120 over 80" (or 120/80). The first ("top") number is called the systolic pressure. It is a measure of the pressure in your arteries when your heart beats. The second ("bottom") number is called the diastolic pressure. It is a measure of the pressure in your arteries as your heart relaxes between beats. Blood pressure is measured in a unit called mm Hg. A normal blood pressure reading is: °? Systolic: below 120. °? Diastolic: below 80. ° °The type of hypertension that you are diagnosed with depends on your test results and when your symptoms developed. °· Chronic hypertension is usually diagnosed before 20 weeks of pregnancy. °· Gestational hypertension is usually diagnosed after 20 weeks of pregnancy. °· Hypertension with high amounts of protein in the urine is diagnosed as preeclampsia. °· Blood pressure measurements that stay above 160 systolic, or above 110 diastolic, are   signs of severe preeclampsia. ° °How is this treated? °Treatment for hypertension during pregnancy varies depending on the type of hypertension you have and how  serious it is. °· If you take medicines called ACE inhibitors to treat chronic hypertension, you may need to switch medicines. ACE inhibitors should not be taken during pregnancy. °· If you have gestational hypertension, you may need to take blood pressure medicine. °· If you are at risk for preeclampsia, your health care provider may recommend that you take a low-dose aspirin every day to prevent high blood pressure during your pregnancy. °· If you have severe preeclampsia, you may need to be hospitalized so you and your baby can be monitored closely. You may also need to take medicine (magnesium sulfate) to prevent seizures and to lower blood pressure. This medicine may be given as an injection or through an IV tube. °· In some cases, if your condition gets worse, you may need to deliver your baby early. ° °Follow these instructions at home: °Eating and drinking °· Drink enough fluid to keep your urine clear or pale yellow. °· Eat a healthy diet that is low in salt (sodium). Do not add salt to your food. Check food labels to see how much sodium a food or beverage contains. °Lifestyle °· Do not use any products that contain nicotine or tobacco, such as cigarettes and e-cigarettes. If you need help quitting, ask your health care provider. °· Do not use alcohol. °· Avoid caffeine. °· Avoid stress as much as possible. Rest and get plenty of sleep. °General instructions °· Take over-the-counter and prescription medicines only as told by your health care provider. °· While lying down, lie on your left side. This keeps pressure off your baby. °· While sitting or lying down, raise (elevate) your feet. Try putting some pillows under your lower legs. °· Exercise regularly. Ask your health care provider what kinds of exercise are best for you. °· Keep all prenatal and follow-up visits as told by your health care provider. This is important. °Contact a health care provider if: °· You have symptoms that your health care  provider told you may require more treatment or monitoring, such as: °? Fever. °? Vomiting. °? Headache. °Get help right away if: °· You have severe abdominal pain or vomiting that does not get better with treatment. °· You suddenly develop swelling in your hands, ankles, or face. °· You gain 4 lbs (1.8 kg) or more in 1 week. °· You develop vaginal bleeding, or you have blood in your urine. °· You do not feel your baby moving as much as usual. °· You have blurred or double vision. °· You have muscle twitching or sudden tightening (spasms). °· You have shortness of breath. °· Your lips or fingernails turn blue. °This information is not intended to replace advice given to you by your health care provider. Make sure you discuss any questions you have with your health care provider. °Document Released: 08/20/2011 Document Revised: 06/21/2016 Document Reviewed: 05/17/2016 °Elsevier Interactive Patient Education © 2018 [employer] ° °

## 2018-06-19 ENCOUNTER — Inpatient Hospital Stay (HOSPITAL_COMMUNITY)
Admission: AD | Admit: 2018-06-19 | Discharge: 2018-06-19 | Disposition: A | Discharge status: Home / Self Care | Payer: Medicaid Other | Source: Ambulatory Visit | Attending: Obstetrics and Gynecology | Admitting: Obstetrics and Gynecology

## 2018-06-19 ENCOUNTER — Encounter (HOSPITAL_COMMUNITY): Payer: Self-pay | Admitting: *Deleted

## 2018-06-19 ENCOUNTER — Ambulatory Visit (INDEPENDENT_AMBULATORY_CARE_PROVIDER_SITE_OTHER): Payer: Medicaid Other | Admitting: Certified Nurse Midwife

## 2018-06-19 VITALS — BP 157/115 | HR 92

## 2018-06-19 DIAGNOSIS — E119 Type 2 diabetes mellitus without complications: Secondary | ICD-10-CM | POA: Insufficient documentation

## 2018-06-19 DIAGNOSIS — O1415 Severe pre-eclampsia, complicating the puerperium: Secondary | ICD-10-CM

## 2018-06-19 DIAGNOSIS — O165 Unspecified maternal hypertension, complicating the puerperium: Secondary | ICD-10-CM

## 2018-06-19 DIAGNOSIS — R51 Headache: Secondary | ICD-10-CM | POA: Diagnosis present

## 2018-06-19 LAB — CBC
HEMATOCRIT: 39.6 % (ref 36.0–46.0)
Hemoglobin: 13.1 g/dL (ref 12.0–15.0)
MCH: 28.4 pg (ref 26.0–34.0)
MCHC: 33.1 g/dL (ref 30.0–36.0)
MCV: 85.7 fL (ref 78.0–100.0)
Platelets: 265 10*3/uL (ref 150–400)
RBC: 4.62 MIL/uL (ref 3.87–5.11)
RDW: 15.6 % — AB (ref 11.5–15.5)
WBC: 5.6 10*3/uL (ref 4.0–10.5)

## 2018-06-19 LAB — COMPREHENSIVE METABOLIC PANEL
ALT: 32 U/L (ref 0–44)
AST: 35 U/L (ref 15–41)
Albumin: 3.3 g/dL — ABNORMAL LOW (ref 3.5–5.0)
Alkaline Phosphatase: 119 U/L (ref 38–126)
Anion gap: 8 (ref 5–15)
BILIRUBIN TOTAL: 0.7 mg/dL (ref 0.3–1.2)
BUN: 14 mg/dL (ref 6–20)
CALCIUM: 8.7 mg/dL — AB (ref 8.9–10.3)
CO2: 22 mmol/L (ref 22–32)
CREATININE: 0.8 mg/dL (ref 0.44–1.00)
Chloride: 106 mmol/L (ref 98–111)
GFR calc Af Amer: >60 mL/min (ref >60)
Glucose, Bld: 109 mg/dL — ABNORMAL HIGH (ref 70–99)
POTASSIUM: 3.3 mmol/L — AB (ref 3.5–5.1)
Sodium: 136 mmol/L (ref 135–145)
TOTAL PROTEIN: 7.1 g/dL (ref 6.5–8.1)

## 2018-06-19 LAB — URINALYSIS, ROUTINE W REFLEX MICROSCOPIC
Bilirubin Urine: NEGATIVE
GLUCOSE, UA: NEGATIVE mg/dL
KETONES UR: NEGATIVE mg/dL
NITRITE: NEGATIVE
PROTEIN: NEGATIVE mg/dL
Specific Gravity, Urine: 1.005 (ref 1.005–1.030)
pH: 6 (ref 5.0–8.0)

## 2018-06-19 LAB — PROTEIN / CREATININE RATIO, URINE
Creatinine, Urine: 57 mg/dL
Protein Creatinine Ratio: 0.19 mg/mg{Cre} — ABNORMAL HIGH (ref 0.00–0.15)
Total Protein, Urine: 11 mg/dL

## 2018-06-19 MED ORDER — IBUPROFEN 600 MG PO TABS
600.0000 mg | ORAL_TABLET | Freq: Once | ORAL | Status: AC
  Administered 2018-06-19: 600 mg via ORAL
  Filled 2018-06-19: qty 1

## 2018-06-19 MED ORDER — LABETALOL HCL 200 MG PO TABS: 200.0000 mg | ORAL_TABLET | Freq: Two times a day (BID) | ORAL | 0 refills | Status: DC

## 2018-06-19 NOTE — MAU Provider Note (Signed)
History     CSN: [id number]  Arrival date and time: 06/19/18 [id number]   First Provider Initiated Contact with Patient 06/19/18 1011      Chief Complaint  Patient presents with  . Hypertension   Ms. Tiffany Hoover is a 27 year old G3P2 post partum since 06/10/18 who presents from an evaluation at the [facility] with a chief complaint of elevated BP and a headache. She states the headache has been occurring on an off and is around a 3/10. Her most recent BP was 143/86 mmHg. She states the pain is felt on the top of her head and along her temples. She has been taking ibuprofen which has given some mild relief. She has taken her amlodipine today.    OB History    Gravida  3   Para  2   Term  2   Preterm      AB  1   Living  2     SAB      TAB  1   Ectopic      Multiple  0   Live Births  2           Past Medical History:  Diagnosis Date  . Diabetes mellitus without complication (HCC)   . Pregnancy induced hypertension     Past Surgical History:  Procedure Laterality Date  . NO PAST SURGERIES      Family History  Problem Relation Age of Onset  . Diabetes Mother   . Hypertension Mother   . Hypertension Sister   . Diabetes Maternal Aunt   . Hypertension Maternal Aunt     Social History   Tobacco Use  . Smoking status: Never Smoker  . Smokeless tobacco: Never Used  Substance Use Topics  . Alcohol use: No  . Drug use: No    Allergies: No Known Allergies  Medications Prior to Admission  Medication Sig Dispense Refill Last Dose  . amLODipine (NORVASC) 10 MG tablet Take 1 tablet (10 mg total) by mouth daily. 30 tablet 0 06/19/2018 at Unknown time  . ibuprofen (ADVIL,MOTRIN) 600 MG tablet Take 1 tablet (600 mg total) by mouth every 6 (six) hours. 30 tablet 0 06/18/2018 at Unknown time  . Prenatal Vit-Fe Fumarate-FA (PREPLUS) 27-1 MG TABS Take 1 tablet by mouth daily. 30 tablet 13 06/19/2018 at Unknown time    Review of Systems  Constitutional: Negative for  chills and fever.  Respiratory: Negative for shortness of breath.   Cardiovascular: Negative for chest pain and palpitations.       Edema In feet that has been present since a month before delivery  Gastrointestinal: Negative for abdominal pain, nausea and vomiting.  Neurological: Positive for headaches. Negative for dizziness and light-headedness.   Physical Exam   Blood pressure (!) 153/105, pulse 84, temperature 98 F (36.7 C), temperature source Oral, resp. rate 18, SpO2 99 %, unknown if currently breastfeeding.  Physical Exam  Constitutional: She appears well-developed. No distress.  HENT:  Head: Normocephalic and atraumatic.  Mild tenderness to palpation of left temple  Cardiovascular: Normal rate and regular rhythm. Exam reveals no gallop and no friction rub.  No murmur heard. Respiratory: Breath sounds normal. She has no wheezes. She has no rales.  Musculoskeletal: She exhibits edema.  Bilateral pedal edema  Skin: Skin is warm and dry. No erythema.   Results for orders placed or performed during the hospital encounter of 06/19/18 (from the past 24 hour(s))  Urinalysis, Routine w reflex microscopic  Status: Abnormal   Collection Time: 06/19/18 10:11 AM  Result Value Ref Range   Color, Urine YELLOW YELLOW   APPearance CLEAR CLEAR   Specific Gravity, Urine 1.005 1.005 - 1.030   pH 6.0 5.0 - 8.0   Glucose, UA NEGATIVE NEGATIVE mg/dL   Hgb urine dipstick LARGE (A) NEGATIVE   Bilirubin Urine NEGATIVE NEGATIVE   Ketones, ur NEGATIVE NEGATIVE mg/dL   Protein, ur NEGATIVE NEGATIVE mg/dL   Nitrite NEGATIVE NEGATIVE   Leukocytes, UA LARGE (A) NEGATIVE   RBC / HPF 0-5 0 - 5 RBC/hpf   WBC, UA 6-10 0 - 5 WBC/hpf   Bacteria, UA RARE (A) NONE SEEN   Squamous Epithelial / LPF 0-5 0 - 5   Mucus PRESENT   Protein / creatinine ratio, urine     Status: Abnormal   Collection Time: 06/19/18 10:11 AM  Result Value Ref Range   Creatinine, Urine 57.00 mg/dL   Total Protein, Urine 11  mg/dL   Protein Creatinine Ratio 0.19 (H) 0.00 - 0.15 mg/mg[Cre]  CBC     Status: Abnormal   Collection Time: 06/19/18 10:46 AM  Result Value Ref Range   WBC 5.6 4.0 - 10.5 K/uL   RBC 4.62 3.87 - 5.11 MIL/uL   Hemoglobin 13.1 12.0 - 15.0 g/dL   HCT 01.0 27.2 - 53.6 %   MCV 85.7 78.0 - 100.0 fL   MCH 28.4 26.0 - 34.0 pg   MCHC 33.1 30.0 - 36.0 g/dL   RDW 64.4 (H) 03.4 - 74.2 %   Platelets 265 150 - 400 K/uL  Comprehensive metabolic panel     Status: Abnormal   Collection Time: 06/19/18 10:46 AM  Result Value Ref Range   Sodium 136 135 - 145 mmol/L   Potassium 3.3 (L) 3.5 - 5.1 mmol/L   Chloride 106 98 - 111 mmol/L   CO2 22 22 - 32 mmol/L   Glucose, Bld 109 (H) 70 - 99 mg/dL   BUN 14 6 - 20 mg/dL   Creatinine, Ser 5.95 0.44 - 1.00 mg/dL   Calcium 8.7 (L) 8.9 - 10.3 mg/dL   Total Protein 7.1 6.5 - 8.1 g/dL   Albumin 3.3 (L) 3.5 - 5.0 g/dL   AST 35 15 - 41 U/L   ALT 32 0 - 44 U/L   Alkaline Phosphatase 119 38 - 126 U/L   Total Bilirubin 0.7 0.3 - 1.2 mg/dL   GFR calc non Af Amer >60 >60 mL/min   GFR calc Af Amer >60 >60 mL/min   Anion gap 8 5 - 15     MAU Course  MDM UA CBC CMP PCr  Assessment and Plan  Post Partum Hypertension  Discharge to home Start Labetolol 200 mg 1 tablet twice a day Take ibuprofen as needed for pain Patient advised to return to MAU if symptoms worsen  Candie Mile 06/19/2018, 10:51 AM

## 2018-06-19 NOTE — Progress Notes (Signed)
Patient delivered on 6/26 by vaginal delivery. She was induced for severe preeclampsia. Pregnancy was complicated by GDM and BMI of 39. Since discharge on 6/28 she has been taken Norvasc 10mg  daily. Last took medication this morning.   [facility] and noted to have severe BP of 152/110 and 157/115. She denies HA, vision changes and RUQ pain. She reports increase swelling of lower extremities.   Plan: Sent to MAU for evaluation and labs for PreE and treatment of severe range BP.  Sharyon CableVeronica C Siddhanth Denk, CNM 06/19/18, 9:34 AM

## 2018-06-19 NOTE — MAU Note (Signed)
Post partem pt, sent from office for further eval. Pt was induced because of elevated BP, was dc'd on Norvasc. BP elevated in office 157/115.

## 2018-06-19 NOTE — MAU Provider Note (Addendum)
History     CSN: [id number]  Arrival date and time: 06/19/18 [id number]   First Provider Initiated Contact with Patient 06/19/18 1011      Chief Complaint  Patient presents with  . Hypertension   HPI  Tiffany Peterbony Bess is a 27 y.o. 273P2012 female who presents from the office for BP evaluation. She is 9 days s/p SVD. Was induced for preeclampsia & received magnesium sulfate during PP period. Was discharged home on Norvasc 10 mg QD & returned to office today for BP check.  In office has 2 severe range BPs of 157/115 & 152/110. Reports taking her medication as prescribed. Has had headaches since being discharged from hospital. Has been taking ibuprofen which helps with symptoms. Currently rates headache 3/10. Denies visual disturbance, or epigastric pain.   OB History    Gravida  3   Para  2   Term  2   Preterm      AB  1   Living  2     SAB      TAB  1   Ectopic      Multiple  0   Live Births  2           Past Medical History:  Diagnosis Date  . Diabetes mellitus without complication (HCC)   . Pregnancy induced hypertension     Past Surgical History:  Procedure Laterality Date  . NO PAST SURGERIES      Family History  Problem Relation Age of Onset  . Diabetes Mother   . Hypertension Mother   . Hypertension Sister   . Diabetes Maternal Aunt   . Hypertension Maternal Aunt     Social History   Tobacco Use  . Smoking status: Never Smoker  . Smokeless tobacco: Never Used  Substance Use Topics  . Alcohol use: No  . Drug use: No    Allergies: No Known Allergies  Medications Prior to Admission  Medication Sig Dispense Refill Last Dose  . amLODipine (NORVASC) 10 MG tablet Take 1 tablet (10 mg total) by mouth daily. 30 tablet 0 06/19/2018 at Unknown time  . ibuprofen (ADVIL,MOTRIN) 600 MG tablet Take 1 tablet (600 mg total) by mouth every 6 (six) hours. 30 tablet 0 06/18/2018 at Unknown time  . Prenatal Vit-Fe Fumarate-FA (PREPLUS) 27-1 MG TABS Take 1 tablet by  mouth daily. 30 tablet 13 06/19/2018 at Unknown time    Review of Systems  Constitutional: Negative.   Eyes: Negative for visual disturbance.  Gastrointestinal: Negative.   Neurological: Negative for headaches.   Physical Exam   Blood pressure (!) 143/86, pulse 75, temperature 98 F (36.7 C), temperature source Oral, resp. rate 18, SpO2 100 %, unknown if currently breastfeeding. Patient Vitals for the past 24 hrs:  BP Temp Temp src Pulse Resp SpO2  06/19/18 1146 (!) 143/86 - - 75 - -  06/19/18 1131 (!) 150/86 - - 74 - -  06/19/18 1116 (!) 148/92 - - 75 - -  06/19/18 1100 (!) 152/101 - - 77 - 100 %  06/19/18 1045 (!) 151/100 - - 81 - 99 %  06/19/18 1030 (!) 158/106 - - 85 - 100 %  06/19/18 1015 (!) 153/105 - - 84 - 99 %  06/19/18 1000 (!) 151/108 - - 84 - 99 %  06/19/18 0952 (!) 156/103 98 F (36.7 C) Oral 87 18 99 %    Physical Exam  Nursing note and vitals reviewed. Constitutional: She is oriented to person, place, and  time. She appears well-developed and well-nourished. No distress.  HENT:  Head: Normocephalic and atraumatic.  Eyes: Conjunctivae are normal. Right eye exhibits no discharge. Left eye exhibits no discharge. No scleral icterus.  Neck: Normal range of motion.  Cardiovascular: Normal rate, regular rhythm and normal heart sounds.  No murmur heard. Respiratory: Effort normal and breath sounds normal. No respiratory distress. She has no wheezes.  GI: Soft. There is no tenderness.  Neurological: She is alert and oriented to person, place, and time. She has normal reflexes.  No clonus  Skin: Skin is warm and dry. She is not diaphoretic.  Psychiatric: She has a normal mood and affect. Her behavior is normal. Judgment and thought content normal.    MAU Course  Procedures Results for orders placed or performed during the hospital encounter of 06/19/18 (from the past 24 hour(s))  Urinalysis, Routine w reflex microscopic     Status: Abnormal   Collection Time:  06/19/18 10:11 AM  Result Value Ref Range   Color, Urine YELLOW YELLOW   APPearance CLEAR CLEAR   Specific Gravity, Urine 1.005 1.005 - 1.030   pH 6.0 5.0 - 8.0   Glucose, UA NEGATIVE NEGATIVE mg/dL   Hgb urine dipstick LARGE (A) NEGATIVE   Bilirubin Urine NEGATIVE NEGATIVE   Ketones, ur NEGATIVE NEGATIVE mg/dL   Protein, ur NEGATIVE NEGATIVE mg/dL   Nitrite NEGATIVE NEGATIVE   Leukocytes, UA LARGE (A) NEGATIVE   RBC / HPF 0-5 0 - 5 RBC/hpf   WBC, UA 6-10 0 - 5 WBC/hpf   Bacteria, UA RARE (A) NONE SEEN   Squamous Epithelial / LPF 0-5 0 - 5   Mucus PRESENT   Protein / creatinine ratio, urine     Status: Abnormal   Collection Time: 06/19/18 10:11 AM  Result Value Ref Range   Creatinine, Urine 57.00 mg/dL   Total Protein, Urine 11 mg/dL   Protein Creatinine Ratio 0.19 (H) 0.00 - 0.15 mg/mg[Cre]  CBC     Status: Abnormal   Collection Time: 06/19/18 10:46 AM  Result Value Ref Range   WBC 5.6 4.0 - 10.5 K/uL   RBC 4.62 3.87 - 5.11 MIL/uL   Hemoglobin 13.1 12.0 - 15.0 g/dL   HCT 16.1 09.6 - 04.5 %   MCV 85.7 78.0 - 100.0 fL   MCH 28.4 26.0 - 34.0 pg   MCHC 33.1 30.0 - 36.0 g/dL   RDW 40.9 (H) 81.1 - 91.4 %   Platelets 265 150 - 400 K/uL  Comprehensive metabolic panel     Status: Abnormal   Collection Time: 06/19/18 10:46 AM  Result Value Ref Range   Sodium 136 135 - 145 mmol/L   Potassium 3.3 (L) 3.5 - 5.1 mmol/L   Chloride 106 98 - 111 mmol/L   CO2 22 22 - 32 mmol/L   Glucose, Bld 109 (H) 70 - 99 mg/dL   BUN 14 6 - 20 mg/dL   Creatinine, Ser 7.82 0.44 - 1.00 mg/dL   Calcium 8.7 (L) 8.9 - 10.3 mg/dL   Total Protein 7.1 6.5 - 8.1 g/dL   Albumin 3.3 (L) 3.5 - 5.0 g/dL   AST 35 15 - 41 U/L   ALT 32 0 - 44 U/L   Alkaline Phosphatase 119 38 - 126 U/L   Total Bilirubin 0.7 0.3 - 1.2 mg/dL   GFR calc non Af Amer >60 >60 mL/min   GFR calc Af Amer >60 >60 mL/min   Anion gap 8 5 - 15  MDM Cycle Bps. None severe range here & patient asymptomatic. C/w Dr. Earlene Plater & will repeat  labs. Labs normal. Will d/c home & add labetalol 200 mg BID to her meds.   Assessment and Plan  A: 1. Postpartum hypertension    P: Discharge home Rx labetalol 200 mg BID Continue norvasc 10 mg QD Discussed reasons to return to MAU Msg to [facility] for BP check in a week  Judeth Horn 06/19/2018, 11:55 AM

## 2018-06-19 NOTE — Discharge Instructions (Signed)
Postpartum Hypertension °Postpartum hypertension is high blood pressure after pregnancy that remains higher than normal for more than two days after delivery. You may not realize that you have postpartum hypertension if your blood pressure is not being checked regularly. In some cases, postpartum hypertension will go away on its own, usually within a week of delivery. However, for some women, medical treatment is required to prevent serious complications, such as seizures or stroke. °The following things can affect your blood pressure: °· The type of delivery you had. °· Having received IV fluids or other medicines during or after delivery. ° °What are the causes? °Postpartum hypertension may be caused by any of the following or by a combination of any of the following: °· Hypertension that existed before pregnancy (chronic hypertension). °· Gestational hypertension. °· Preeclampsia or eclampsia. °· Receiving a lot of fluid through an IV during or after delivery. °· Medicines. °· HELLP syndrome. °· Hyperthyroidism. °· Stroke. °· Other rare neurological or blood disorders. ° °In some cases, the cause may not be known. °What increases the risk? °Postpartum hypertension can be related to one or more risk factors, such as: °· Chronic hypertension. In some cases, this may not have been diagnosed before pregnancy. °· Obesity. °· Type 2 diabetes. °· Kidney disease. °· Family history of preeclampsia. °· Other medical conditions that cause hormonal imbalances. ° °What are the signs or symptoms? °As with all types of hypertension, postpartum hypertension may not have any symptoms. Depending on how high your blood pressure is, you may experience: °· Headaches. These may be mild, moderate, or severe. They may also be steady, constant, or sudden in onset (thunderclap headache). °· Visual changes. °· Dizziness. °· Shortness of breath. °· Swelling of your hands, feet, lower legs, or face. In some cases, you may have swelling in  more than one of these locations. °· Heart palpitations or a racing heartbeat. °· Difficulty breathing while lying down. °· Decreased urination. ° °Other rare signs and symptoms may include: °· Sweating more than usual. This lasts longer than a few days after delivery. °· Chest pain. °· Sudden dizziness when you get up from sitting or lying down. °· Seizures. °· Nausea or vomiting. °· Abdominal pain. ° °How is this diagnosed? °The diagnosis of postpartum hypertension is made through a combination of physical examination findings and testing of your blood and urine. You may also have additional tests, such as a CT scan or an MRI, to check for other complications of postpartum hypertension. °How is this treated? °When blood pressure is high enough to require treatment, your options may include: °· Medicines to reduce blood pressure (antihypertensives). Tell your health care provider if you are breastfeeding or if you plan to breastfeed. There are many antihypertensive medicines that are safe to take while breastfeeding. °· Stopping medicines that may be causing hypertension. °· Treating medical conditions that are causing hypertension. °· Treating the complications of hypertension, such as seizures, stroke, or kidney problems. ° °Your health care provider will also continue to monitor your blood pressure closely and repeatedly until it is within a safe range for you. °Follow these instructions at home: °· Take medicines only as directed by your health care provider. °· Get regular exercise after your health care provider tells you that it is safe. °· Follow your health care provider’s recommendations on fluid and salt restrictions. °· Do not use any tobacco products, including cigarettes, chewing tobacco, or electronic cigarettes. If you need help quitting, ask your health care provider. °·   Keep all follow-up visits as directed by your health care provider. This is important. °Contact a health care provider  if: °· Your symptoms get worse. °· You have new symptoms, such as: °? Headache. °? Dizziness. °? Visual changes. °Get help right away if: °· You develop a severe or sudden headache. °· You have seizures. °· You develop numbness or weakness on one side of your body. °· You have difficulty thinking, speaking, or swallowing. °· You develop severe abdominal pain. °· You develop difficulty breathing, chest pain, a racing heartbeat, or heart palpitations. °These symptoms may represent a serious problem that is an emergency. Do not wait to see if the symptoms will go away. Get medical help right away. Call your local emergency services (911 in the [place] Do not drive yourself to the hospital. °This information is not intended to replace advice given to you by your health care provider. Make sure you discuss any questions you have with your health care provider. °Document Released: 08/05/2014 Document Revised: 05/06/2016 Document Reviewed: 06/16/2014 °Elsevier Interactive Patient Education © 2018 [employer] ° °

## 2018-06-19 NOTE — Progress Notes (Signed)
Subjective:  Tiffany Hoover is a 27 y.o. female here for BP check.   Hypertension ROS: taking medications as instructed, no medication side effects noted, no TIA's, no chest pain on exertion, no dyspnea on exertion and no swelling of ankles.    Objective:  LMP 09/11/2017   Appearance alert, well appearing, and in no distress. General exam BP noted to be well controlled today in office.    Assessment:   Blood Pressure needs improvement.   Plan:  Pt sent to MAU for evaluation.

## 2018-06-23 ENCOUNTER — Encounter (HOSPITAL_COMMUNITY): Payer: Self-pay

## 2018-06-23 ENCOUNTER — Inpatient Hospital Stay (HOSPITAL_COMMUNITY)
Admission: AD | Admit: 2018-06-23 | Discharge: 2018-06-23 | Disposition: A | Discharge status: Home / Self Care | Payer: Medicaid Other | Source: Ambulatory Visit | Attending: Obstetrics and Gynecology | Admitting: Obstetrics and Gynecology

## 2018-06-23 ENCOUNTER — Other Ambulatory Visit: Payer: Self-pay

## 2018-06-23 ENCOUNTER — Ambulatory Visit (INDEPENDENT_AMBULATORY_CARE_PROVIDER_SITE_OTHER): Payer: Medicaid Other

## 2018-06-23 VITALS — BP 157/108 | HR 69

## 2018-06-23 DIAGNOSIS — O165 Unspecified maternal hypertension, complicating the puerperium: Secondary | ICD-10-CM

## 2018-06-23 DIAGNOSIS — O9089 Other complications of the puerperium, not elsewhere classified: Secondary | ICD-10-CM | POA: Insufficient documentation

## 2018-06-23 DIAGNOSIS — R030 Elevated blood-pressure reading, without diagnosis of hypertension: Secondary | ICD-10-CM

## 2018-06-23 DIAGNOSIS — R51 Headache: Secondary | ICD-10-CM | POA: Insufficient documentation

## 2018-06-23 DIAGNOSIS — R519 Headache, unspecified: Secondary | ICD-10-CM

## 2018-06-23 LAB — PROTEIN / CREATININE RATIO, URINE
Creatinine, Urine: 150 mg/dL
Protein Creatinine Ratio: 0.09 mg/mg{Cre} (ref 0.00–0.15)
Total Protein, Urine: 14 mg/dL

## 2018-06-23 LAB — COMPREHENSIVE METABOLIC PANEL
ALBUMIN: 3.5 g/dL (ref 3.5–5.0)
ALT: 30 U/L (ref 0–44)
AST: 31 U/L (ref 15–41)
Alkaline Phosphatase: 107 U/L (ref 38–126)
Anion gap: 9 (ref 5–15)
BUN: 7 mg/dL (ref 6–20)
CHLORIDE: 106 mmol/L (ref 98–111)
CO2: 21 mmol/L — AB (ref 22–32)
Calcium: 8.7 mg/dL — ABNORMAL LOW (ref 8.9–10.3)
Creatinine, Ser: 0.81 mg/dL (ref 0.44–1.00)
GFR calc Af Amer: >60 mL/min (ref >60)
GFR calc non Af Amer: >60 mL/min (ref >60)
GLUCOSE: 98 mg/dL (ref 70–99)
POTASSIUM: 3.4 mmol/L — AB (ref 3.5–5.1)
Sodium: 136 mmol/L (ref 135–145)
Total Bilirubin: 0.6 mg/dL (ref 0.3–1.2)
Total Protein: 6.6 g/dL (ref 6.5–8.1)

## 2018-06-23 LAB — URINALYSIS, ROUTINE W REFLEX MICROSCOPIC
BILIRUBIN URINE: NEGATIVE
Glucose, UA: NEGATIVE mg/dL
Ketones, ur: NEGATIVE mg/dL
NITRITE: NEGATIVE
PROTEIN: NEGATIVE mg/dL
Specific Gravity, Urine: 1.01 (ref 1.005–1.030)
pH: 6 (ref 5.0–8.0)

## 2018-06-23 LAB — POCT URINALYSIS DIPSTICK
BILIRUBIN UA: NEGATIVE
GLUCOSE UA: NEGATIVE
Ketones, UA: NEGATIVE
Nitrite, UA: NEGATIVE
Protein, UA: POSITIVE — AB
SPEC GRAV UA: 1.02 (ref 1.010–1.025)
Urobilinogen, UA: 0.2 E.U./dL
pH, UA: 6 (ref 5.0–8.0)

## 2018-06-23 LAB — CBC
HCT: 38.3 % (ref 36.0–46.0)
Hemoglobin: 12.6 g/dL (ref 12.0–15.0)
MCH: 28.5 pg (ref 26.0–34.0)
MCHC: 32.9 g/dL (ref 30.0–36.0)
MCV: 86.7 fL (ref 78.0–100.0)
PLATELETS: 288 10*3/uL (ref 150–400)
RBC: 4.42 MIL/uL (ref 3.87–5.11)
RDW: 15.7 % — AB (ref 11.5–15.5)
WBC: 4.5 10*3/uL (ref 4.0–10.5)

## 2018-06-23 MED ORDER — FUROSEMIDE 20 MG PO TABS
20.0000 mg | ORAL_TABLET | Freq: Once | ORAL | Status: AC
  Administered 2018-06-23: 20 mg via ORAL
  Filled 2018-06-23: qty 1

## 2018-06-23 MED ORDER — POTASSIUM CHLORIDE CRYS ER 20 MEQ PO TBCR
40.0000 meq | EXTENDED_RELEASE_TABLET | Freq: Once | ORAL | Status: AC
  Administered 2018-06-23: 40 meq via ORAL
  Filled 2018-06-23: qty 2

## 2018-06-23 MED ORDER — AMLODIPINE BESYLATE 10 MG PO TABS
10.0000 mg | ORAL_TABLET | Freq: Every day | ORAL | Status: DC
  Administered 2018-06-23: 10 mg via ORAL
  Filled 2018-06-23 (×2): qty 1

## 2018-06-23 MED ORDER — ACETAMINOPHEN 500 MG PO TABS
1000.0000 mg | ORAL_TABLET | Freq: Once | ORAL | Status: AC
  Administered 2018-06-23: 1000 mg via ORAL
  Filled 2018-06-23: qty 2

## 2018-06-23 MED ORDER — LABETALOL HCL 100 MG PO TABS
200.0000 mg | ORAL_TABLET | Freq: Once | ORAL | Status: AC
  Administered 2018-06-23: 200 mg via ORAL
  Filled 2018-06-23: qty 2

## 2018-06-23 NOTE — MAU Note (Signed)
Pt sent for BP eval, was seen in office today for BP check.  Reports mild H/A.  Denies visual disturbances or epigastric pain. S/P NVD on 06/10/2018 Reports Pre Eclampsia @ end of pregnancy.

## 2018-06-23 NOTE — MAU Provider Note (Signed)
History     CSN: [id number]  Arrival date and time: 06/23/18 1444   None     Chief Complaint  Patient presents with  . BP Eval   HPI   Ms.Tiffany Hoover is a 27 y.o. female G3P2012 status post vaginal delivery on 6/29; she was induced due to preeclampsia. She received magnesium post partum. She was sent home with norvasc 10 mg daily. She noticed a spike in her BP on 7/5 and presented to MAU. She was given an additional RX of labetalol 200 mg PO to start. She was under the impression that she was to stop the norvasc, and start the labetalol. She has not taken the norvasc in a week. She has been taking her labetalol 200 mg BID as prescribed.   OB History    Gravida  3   Para  2   Term  2   Preterm      AB  1   Living  2     SAB      TAB  1   Ectopic      Multiple  0   Live Births  2           Past Medical History:  Diagnosis Date  . Diabetes mellitus without complication ([place])   . Pregnancy induced hypertension     Past Surgical History:  Procedure Laterality Date  . NO PAST SURGERIES      Family History  Problem Relation Age of Onset  . Diabetes Mother   . Hypertension Mother   . Hypertension Sister   . Diabetes Maternal Aunt   . Hypertension Maternal Aunt     Social History   Tobacco Use  . Smoking status: Never Smoker  . Smokeless tobacco: Never Used  Substance Use Topics  . Alcohol use: No  . Drug use: No    Allergies: No Known Allergies  Medications Prior to Admission  Medication Sig Dispense Refill Last Dose  . amLODipine (NORVASC) 10 MG tablet Take 1 tablet (10 mg total) by mouth daily. 30 tablet 0 06/19/2018 at Unknown time  . ibuprofen (ADVIL,MOTRIN) 600 MG tablet Take 1 tablet (600 mg total) by mouth every 6 (six) hours. 30 tablet 0 06/18/2018 at Unknown time  . labetalol (NORMODYNE) 200 MG tablet Take 1 tablet (200 mg total) by mouth 2 (two) times daily. 60 tablet 0   . Prenatal Vit-Fe Fumarate-FA (PREPLUS) 27-1 MG TABS Take 1  tablet by mouth daily. 30 tablet 13 06/19/2018 at Unknown time   Results for orders placed or performed during the hospital encounter of 06/23/18 (from the past 48 hour(s))  CBC     Status: Abnormal   Collection Time: 06/23/18  3:15 PM  Result Value Ref Range   WBC 4.5 4.0 - 10.5 K/uL   RBC 4.42 3.87 - 5.11 MIL/uL   Hemoglobin 12.6 12.0 - 15.0 g/dL   HCT 38.3 36.0 - 46.0 %   MCV 86.7 78.0 - 100.0 fL   MCH 28.5 26.0 - 34.0 pg   MCHC 32.9 30.0 - 36.0 g/dL   RDW 15.7 (H) 11.5 - 15.5 %   Platelets 288 150 - 400 K/uL    Comment: Performed at [facility], [address] [place], [place] [address]  Comprehensive metabolic panel     Status: Abnormal   Collection Time: 06/23/18  3:15 PM  Result Value Ref Range   Sodium 136 135 - 145 mmol/L   Potassium 3.4 (L) 3.5 - 5.1 mmol/L  Chloride 106 98 - 111 mmol/L    Comment: Please note change in reference range.   CO2 21 (L) 22 - 32 mmol/L   Glucose, Bld 98 70 - 99 mg/dL    Comment: Please note change in reference range.   BUN 7 6 - 20 mg/dL    Comment: Please note change in reference range.   Creatinine, Ser 0.81 0.44 - 1.00 mg/dL   Calcium 8.7 (L) 8.9 - 10.3 mg/dL   Total Protein 6.6 6.5 - 8.1 g/dL   Albumin 3.5 3.5 - 5.0 g/dL   AST 31 15 - 41 U/L   ALT 30 0 - 44 U/L    Comment: Please note change in reference range.   Alkaline Phosphatase 107 38 - 126 U/L   Total Bilirubin 0.6 0.3 - 1.2 mg/dL   GFR calc non Af Amer >60 >60 mL/min   GFR calc Af Amer >60 >60 mL/min    Comment: (NOTE) The eGFR has been calculated using the CKD EPI equation. This calculation has not been validated in all clinical situations. eGFR's persistently <60 mL/min signify possible Chronic Kidney Disease.    Anion gap 9 5 - 15    Comment: Performed at [facility], [address], [place], [place] [address]  Urinalysis, Routine w reflex microscopic     Status: Abnormal   Collection Time: 06/23/18  3:16 PM  Result Value Ref Range   Color, Urine YELLOW  YELLOW   APPearance CLEAR CLEAR   Specific Gravity, Urine 1.010 1.005 - 1.030   pH 6.0 5.0 - 8.0   Glucose, UA NEGATIVE NEGATIVE mg/dL   Hgb urine dipstick MODERATE (A) NEGATIVE   Bilirubin Urine NEGATIVE NEGATIVE   Ketones, ur NEGATIVE NEGATIVE mg/dL   Protein, ur NEGATIVE NEGATIVE mg/dL   Nitrite NEGATIVE NEGATIVE   Leukocytes, UA MODERATE (A) NEGATIVE   RBC / HPF 6-10 0 - 5 RBC/hpf   WBC, UA 21-50 0 - 5 WBC/hpf   Bacteria, UA RARE (A) NONE SEEN   Squamous Epithelial / LPF 0-5 0 - 5    Comment: Performed at [facility], [address], [place], [place] [address]  Protein / creatinine ratio, urine     Status: None   Collection Time: 06/23/18  3:16 PM  Result Value Ref Range   Creatinine, Urine 150.00 mg/dL   Total Protein, Urine 14 mg/dL    Comment: NO NORMAL RANGE ESTABLISHED FOR THIS TEST   Protein Creatinine Ratio 0.09 0.00 - 0.15 mg/mg[Cre]    Comment: Performed at [facility], [address] [place], [place] [address]   Review of Systems  Eyes: Negative for photophobia, redness and visual disturbance.  Gastrointestinal: Positive for abdominal pain.  Neurological: Positive for headaches (left sided, throbbing pain. Has not taken medication at home to treat it. ).   Physical Exam   Blood pressure (!) 155/107, pulse 60, height '5\' 4"'  (1.626 m), weight 193 lb 12 oz (87.9 kg), SpO2 100 %, unknown if currently breastfeeding.   Patient Vitals for the past 24 hrs:  BP Temp src Pulse SpO2 Height Weight  06/23/18 1732 136/84 - 67 - - -  06/23/18 1701 136/84 - 67 - - -  06/23/18 1646 (!) 148/98 - 66 - - -  06/23/18 1631 (!) 142/88 - (!) 56 - - -  06/23/18 1616 (!) 153/94 - (!) 56 - - -  06/23/18 1601 (!) 164/101 - (!) 58 - - -  06/23/18 1600 (!) 164/101 - - - - -  06/23/18 1546 (!) 155/107 - 60 - - -  06/23/18 1545 (!) 155/107 - 60 - - -  06/23/18 1533 (!) 159/106 - (!) 58 - - -  06/23/18 1518 (!) 162/105 - 63 - - -  06/23/18 1457 (!) 160/104 Oral 67 100 % '5\' 4"'   (1.626 m) 193 lb 12 oz (87.9 kg)   Physical Exam  Constitutional: She is oriented to person, place, and time. She appears well-developed and well-nourished. No distress.  HENT:  Head: Normocephalic.  Eyes: Pupils are equal, round, and reactive to light.  Cardiovascular: Normal rate.  Respiratory: Effort normal and breath sounds normal. No respiratory distress. She has no wheezes. She has no rales. She exhibits no tenderness.  GI: Soft. She exhibits no distension. There is no tenderness.  Musculoskeletal: Normal range of motion. She exhibits edema (2+ bilateral pitting edema ).  Neurological: She is alert and oriented to person, place, and time. She has normal reflexes. She displays normal reflexes.  Negative clonus   Skin: Skin is warm. She is not diaphoretic.  Psychiatric: Her behavior is normal.   MAU Course  Procedures  MDM  PIH labs WNL  Norvasc 10 mg given PO Labetalol 200 mg PO  Last BP 136/84 prior to DC  Tylenol 1 gram given PO, HA down to 0/10 Discussed BP, labs and exam with Dr. Nehemiah Settle, ok for DC home.  2+ pitting edema in bilateral lower extremities, lungs CTA, 20 mg PO lasix given prior to dc K+ 3.4: Potassium kdur 40 meq given X 1 PO prior to DC home   Assessment and Plan   A:  1. Postpartum hypertension   2. Postpartum headache     P:  Discharge home with strict return precautions Message sent to [facility] to schedule a BP check in the office on Thurs. Patient made aware Discussed in detail how to take her medications: Norvasc 10 mg PO daily & Labetalol 200 mg BID. Patient aware Preeclampsia precautions discussed in detail Ok to use tylenol or ibuprofen for HA at home, if no improvement return to MAU Increase rest and fluids.   Lezlie Lye, NP 06/23/2018 5:44 PM

## 2018-06-23 NOTE — Progress Notes (Signed)
Subjective:  Tiffany Hoover is a 27 y.o. female here for BP check.   Hypertension ROS: taking medications as instructed, no medication side effects noted, no TIA's, no chest pain on exertion, no dyspnea on exertion, noting swelling of ankles, no orthostatic dizziness or lightheadedness, no orthopnea or paroxysmal nocturnal dyspnea, noting orthopnea, noting episodes of paroxysmal nocturnal dyspnea, no palpitations, no intermittent claudication symptoms and no erectile dysfunction.    Objective:  BP (!) 157/108   Pulse 69   Appearance alert, well appearing, and in no distress. General exam BP noted to be well controlled today in office.    Assessment:   Blood Pressure needs further observation.   Plan:  Per Dr.Harper Pt needs evaluation at hospital. .

## 2018-06-23 NOTE — Discharge Instructions (Signed)
Preeclampsia and Eclampsia °Preeclampsia is a serious condition that develops only during pregnancy. It is also called toxemia of pregnancy. This condition causes high blood pressure along with other symptoms, such as swelling and headaches. These symptoms may develop as the condition gets worse. Preeclampsia may occur at 20 weeks of pregnancy or later. °Diagnosing and treating preeclampsia early is very important. If not treated early, it can cause serious problems for you and your baby. One problem it can lead to is eclampsia, which is a condition that causes muscle jerking or shaking (convulsions or seizures) in the mother. Delivering your baby is the best treatment for preeclampsia or eclampsia. Preeclampsia and eclampsia symptoms usually go away after your baby is born. °What are the causes? °The cause of preeclampsia is not known. °What increases the risk? °The following risk factors make you more likely to develop preeclampsia: °· Being pregnant for the first time. °· Having had preeclampsia during a past pregnancy. °· Having a family history of preeclampsia. °· Having high blood pressure. °· Being pregnant with twins or triplets. °· Being 35 or older. °· Being African-American. °· Having kidney disease or diabetes. °· Having medical conditions such as lupus or blood diseases. °· Being very overweight (obese). ° °What are the signs or symptoms? °The earliest signs of preeclampsia are: °· High blood pressure. °· Increased protein in your urine. Your health care provider will check for this at every visit before you give birth (prenatal visit). ° °Other symptoms that may develop as the condition gets worse include: °· Severe headaches. °· Sudden weight gain. °· Swelling of the hands, face, legs, and feet. °· Nausea and vomiting. °· Vision problems, such as blurred or double vision. °· Numbness in the face, arms, legs, and feet. °· Urinating less than usual. °· Dizziness. °· Slurred speech. °· Abdominal pain,  especially upper abdominal pain. °· Convulsions or seizures. ° °Symptoms generally go away after giving birth. °How is this diagnosed? °There are no screening tests for preeclampsia. Your health care provider will ask you about symptoms and check for signs of preeclampsia during your prenatal visits. You may also have tests that include: °· Urine tests. °· Blood tests. °· Checking your blood pressure. °· Monitoring your baby’s heart rate. °· Ultrasound. ° °How is this treated? °You and your health care provider will determine the treatment approach that is best for you. Treatment may include: °· Having more frequent prenatal exams to check for signs of preeclampsia, if you have an increased risk for preeclampsia. °· Bed rest. °· Reducing how much salt (sodium) you eat. °· Medicine to lower your blood pressure. °· Staying in the hospital, if your condition is severe. There, treatment will focus on controlling your blood pressure and the amount of fluids in your body (fluid retention). °· You may need to take medicine (magnesium sulfate) to prevent seizures. This medicine may be given as an injection or through an IV tube. °· Delivering your baby early, if your condition gets worse. You may have your labor started with medicine (induced), or you may have a cesarean delivery. ° °Follow these instructions at home: °Eating and drinking ° °· Drink enough fluid to keep your urine clear or pale yellow. °· Eat a healthy diet that is low in sodium. Do not add salt to your food. Check nutrition labels to see how much sodium a food or beverage contains. °· Avoid caffeine. °Lifestyle °· Do not use any products that contain nicotine or tobacco, such as cigarettes   and e-cigarettes. If you need help quitting, ask your health care provider. °· Do not use alcohol or drugs. °· Avoid stress as much as possible. Rest and get plenty of sleep. °General instructions °· Take over-the-counter and prescription medicines only as told by your  health care provider. °· When lying down, lie on your side. This keeps pressure off of your baby. °· When sitting or lying down, raise (elevate) your feet. Try putting some pillows underneath your lower legs. °· Exercise regularly. Ask your health care provider what kinds of exercise are best for you. °· Keep all follow-up and prenatal visits as told by your health care provider. This is important. °How is this prevented? °To prevent preeclampsia or eclampsia from developing during another pregnancy: °· Get proper medical care during pregnancy. Your health care provider may be able to prevent preeclampsia or diagnose and treat it early. °· Your health care provider may have you take a low-dose aspirin or a calcium supplement during your next pregnancy. °· You may have tests of your blood pressure and kidney function after giving birth. °· Maintain a healthy weight. Ask your health care provider for help managing weight gain during pregnancy. °· Work with your health care provider to manage any long-term (chronic) health conditions you have, such as diabetes or kidney problems. ° °Contact a health care provider if: °· You gain more weight than expected. °· You have headaches. °· You have nausea or vomiting. °· You have abdominal pain. °· You feel dizzy or light-headed. °Get help right away if: °· You develop sudden or severe swelling anywhere in your body. This usually happens in the legs. °· You gain 5 lbs (2.3 kg) or more during one week. °· You have severe: °? Abdominal pain. °? Headaches. °? Dizziness. °? Vision problems. °? Confusion. °? Nausea or vomiting. °· You have a seizure. °· You have trouble moving any part of your body. °· You develop numbness in any part of your body. °· You have trouble speaking. °· You have any abnormal bleeding. °· You pass out. °This information is not intended to replace advice given to you by your health care provider. Make sure you discuss any questions you have with your health  care provider. °Document Released: 11/29/2000 Document Revised: 07/30/2016 Document Reviewed: 07/08/2016 °Elsevier Interactive Patient Education © 2018 [employer] ° °

## 2018-06-25 ENCOUNTER — Encounter: Payer: Self-pay | Admitting: Obstetrics

## 2018-06-25 ENCOUNTER — Ambulatory Visit (INDEPENDENT_AMBULATORY_CARE_PROVIDER_SITE_OTHER): Payer: Medicaid Other | Admitting: Obstetrics

## 2018-06-25 VITALS — BP 146/93 | HR 82

## 2018-06-25 DIAGNOSIS — O165 Unspecified maternal hypertension, complicating the puerperium: Secondary | ICD-10-CM

## 2018-06-25 IMAGING — US US MFM OB FOLLOW-UP
1 series · 12 of 28 positions shown · non-contrast
Comparison: none

[Series 1: us mfm ob follow-up · 12 of 52 slices shown]
[im 2/52]
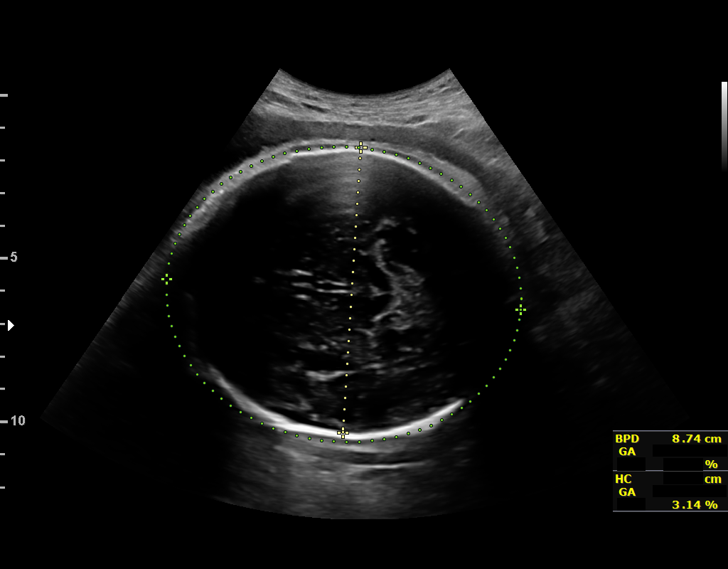
[im 6/52]
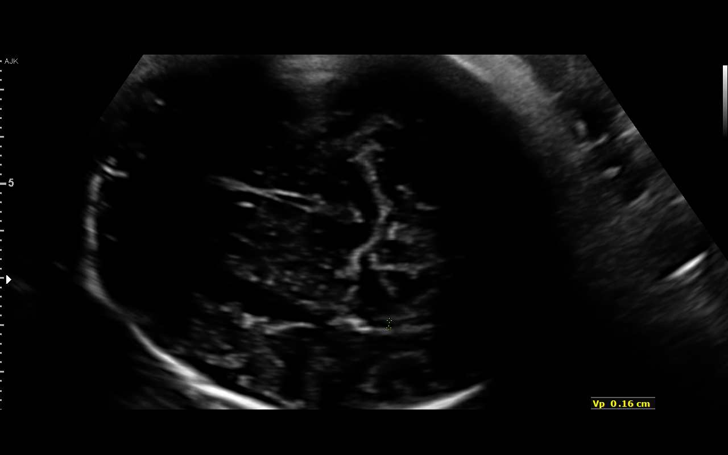
[im 10/52]
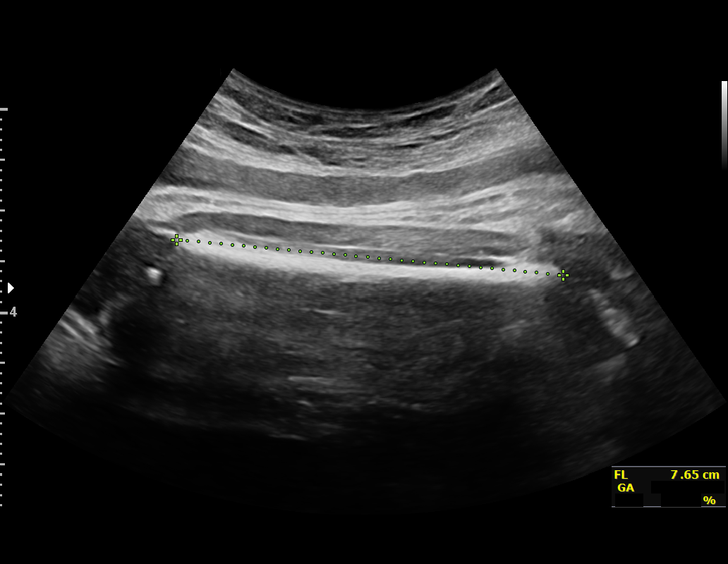
[im 16/52]
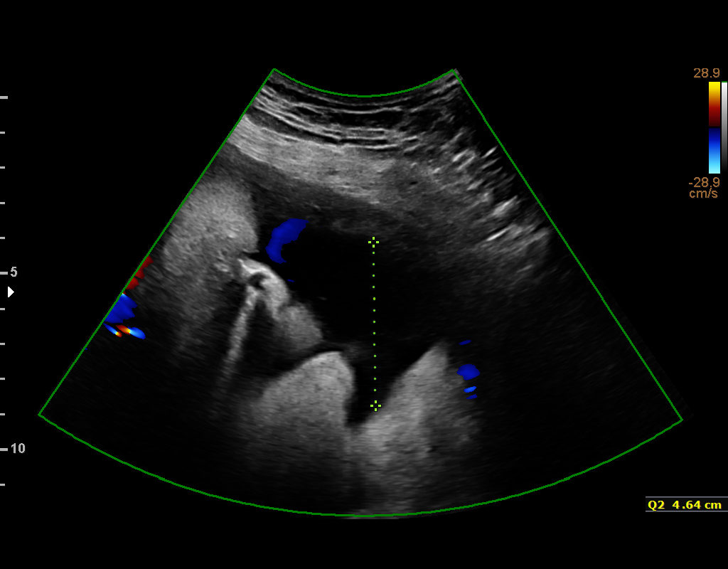
[im 19/52]
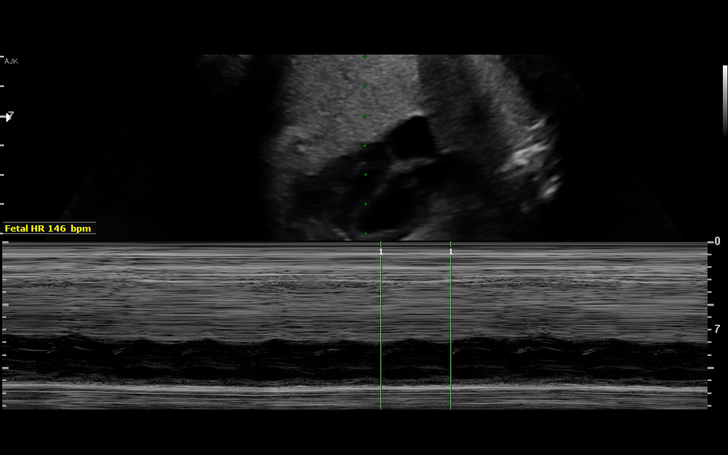
[im 23/52]
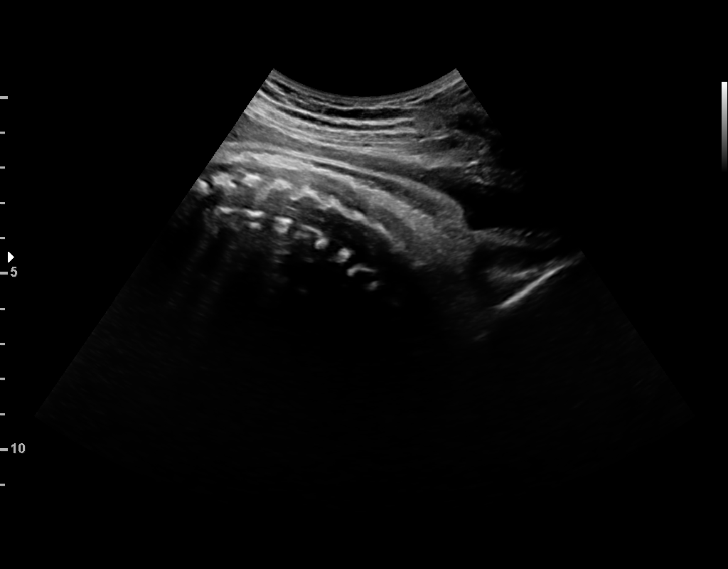
[im 29/52]
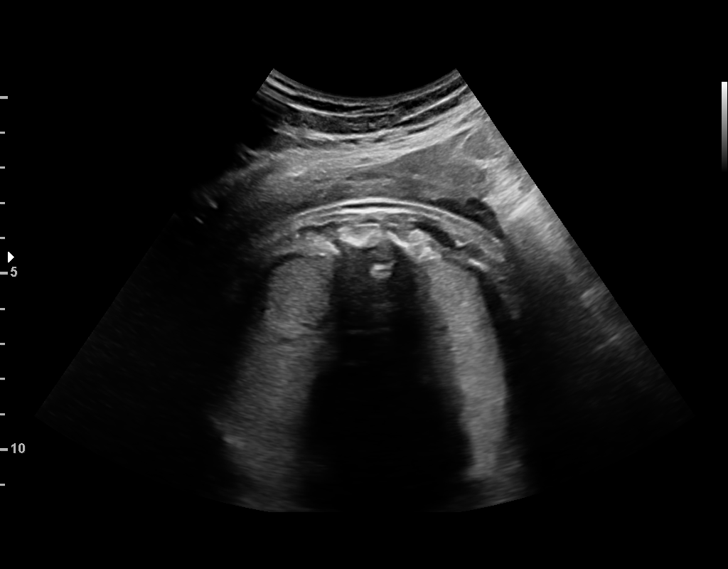
[im 33/52]
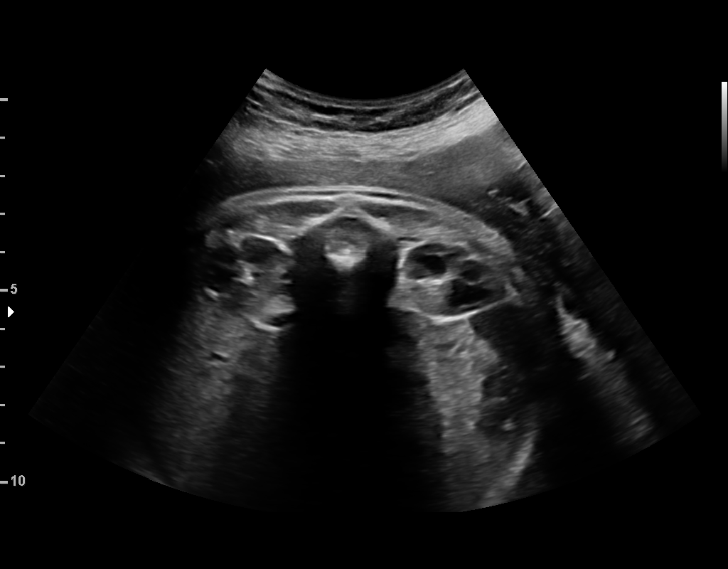
[im 36/52]
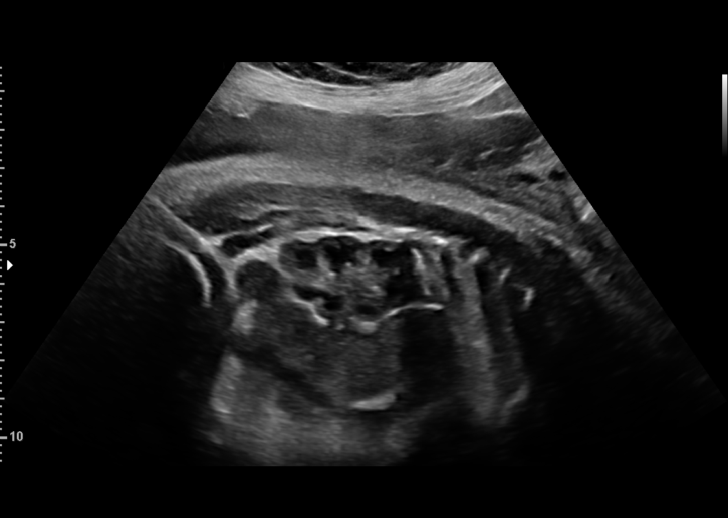
[im 42/52]
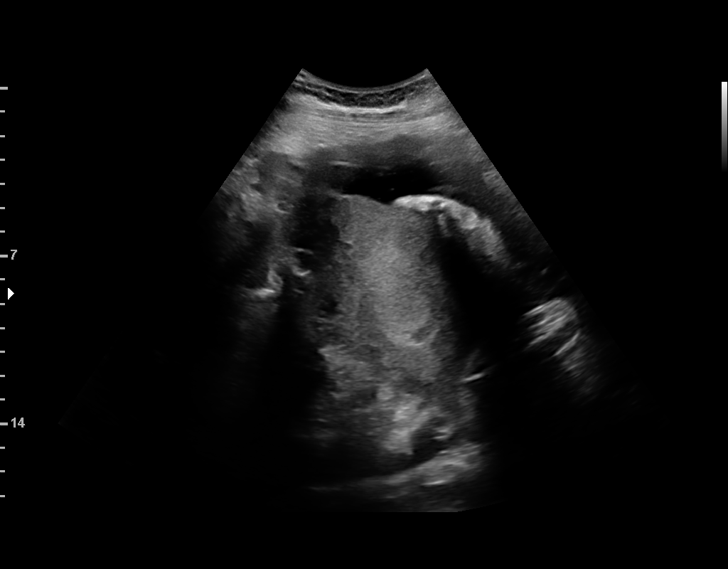
[im 46/52]
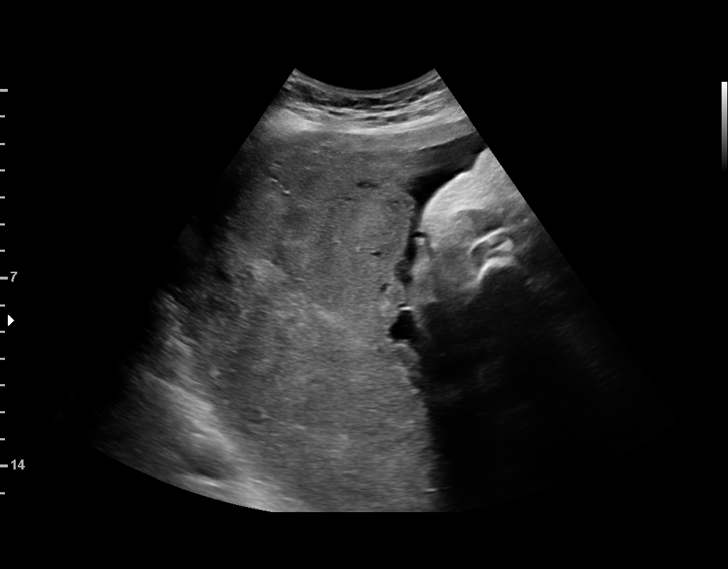
[im 50/52]
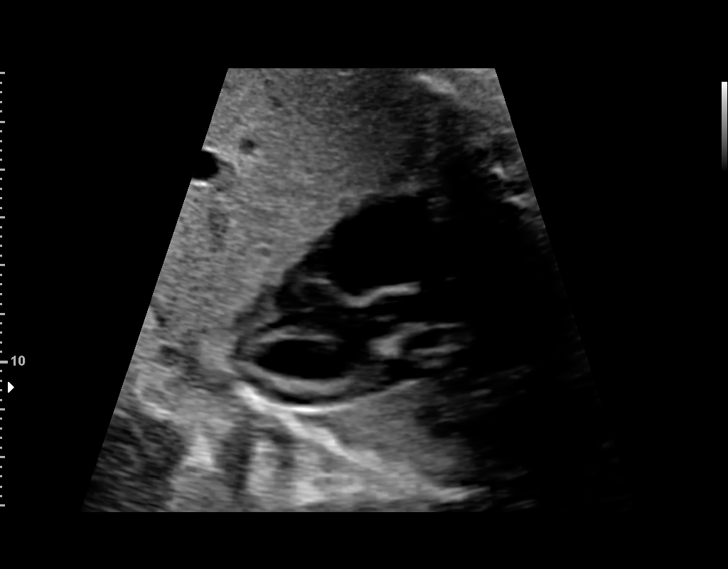

[12 of 28 positions shown; findings below may reference images not displayed]

[REDACTED]care -
[REDACTED]
[REDACTED]

1  ARSIANTI ARIESTA            [id number]      [id number]     [id number]
Indications

36 weeks gestation of pregnancy
Poor obstetric history: Previous gestational
diabetes
Poor obstetric history: Previous gestational
HTN
Encounter for other antenatal screening
follow-up
Gestational diabetes in pregnancy, diet
controlled
OB History

Gravidity:    3         Term:   1         SAB:   1
Living:       1
Fetal Evaluation

Num Of Fetuses:     1
Fetal Heart         146
Rate(bpm):
Cardiac Activity:   Observed
Presentation:       Cephalic
Placenta:           Posterior, above cervical os
P. Cord Insertion:  Visualized
Amniotic Fluid
AFI FV:      Subjectively within normal limits

AFI Sum(cm)     %Tile       Largest Pocket(cm)
9.57            20

RUQ(cm)                     LUQ(cm)        LLQ(cm)
4.11
Biometry

BPD:      87.2  mm     G. Age:  35w 1d         20  %    CI:        77.74   %    70 - 86
FL/HC:      23.6   %    20.8 -
HC:       313   mm     G. Age:  35w 1d        < 3  %    HC/AC:      0.82        0.92 -
AC:      382.9  mm     G. Age:  N/A          > 97  %    FL/BPD:     84.6   %    71 - 87
FL:       73.8  mm     G. Age:  37w 6d         71  %    FL/AC:      19.3   %    20 - 24

Est. FW:    0930  gm      8 lb 6 oz   > 90  %
Gestational Age

LMP:           35w 5d        Date:  09/11/17                 EDD:   06/18/18
U/S Today:     36w 0d                                        EDD:   06/16/18
Best:          36w 6d     Det. By:  Early Ultrasound         EDD:   06/10/18
(10/13/17)
Anatomy

Cranium:               Appears normal         Aortic Arch:            Previously seen
Cavum:                 Appears normal         Ductal Arch:            Previously seen
Ventricles:            Appears normal         Diaphragm:              Appears normal
Choroid Plexus:        Previously seen        Stomach:                Appears normal, left
sided
Cerebellum:            Previously seen        Abdomen:                Appears normal
Posterior Fossa:       Previously seen        Abdominal Wall:         Previously seen
Nuchal Fold:           Previously seen        Cord Vessels:           Previously seen
Face:                  Orbits and profile     Kidneys:                Appear normal
previously seen
Lips:                  Previously seen        Bladder:                Appears normal
Thoracic:              Appears normal         Spine:                  Appears normal
Heart:                 Previously seen        Upper Extremities:      Previously seen
RVOT:                  Appears normal         Lower Extremities:      Previously seen
LVOT:                  Appears normal

Other:  Fetus appears to be a female. Heels and 5th digit previously seen.
Cervix Uterus Adnexa

Cervix
Not visualized (advanced GA >16wks)

Uterus
No abnormality visualized.

Left Ovary
Not visualized.

Right Ovary
Not visualized.

Adnexa:       No abnormality visualized.
Impression

SIUP at 36+6 weeks
Cephalic presentation
Normal interval anatomy; anatomic survey complete
Normal amniotic fluid volume
EFW > 90th %tile; AC > 97th %tile; 0930 grams; 8+6; fetus at
risk to be LGA/macrosomic
Recommendations

Follow-up as clinically indicated

## 2018-06-25 MED ORDER — LABETALOL HCL 200 MG PO TABS: 400.0000 mg | ORAL_TABLET | Freq: Two times a day (BID) | ORAL | 2 refills | Status: DC

## 2018-06-25 NOTE — Progress Notes (Signed)
Subjective:  Tiffany Hoover is a 27 y.o. female here for BP check.   Hypertension ROS: taking medications as instructed, no medication side effects noted, no TIA's, no chest pain on exertion, no dyspnea on exertion, no swelling of ankles, no orthostatic dizziness or lightheadedness, no orthopnea or paroxysmal nocturnal dyspnea, no palpitations, no intermittent claudication symptoms and no erectile dysfunction.    Objective:  BP (!) 146/93   Pulse 82   Appearance alert, well appearing, and in no distress. General exam BP noted to be well controlled today in office.    Assessment:   Blood Pressure consulted w/ Dr.Harper .   Plan:  Continue medication management as directed and keep scheduled appt .

## 2018-06-25 NOTE — Patient Instructions (Signed)
Labetalol increased

## 2018-06-25 NOTE — Progress Notes (Signed)
I reviewed the BP and agree with the nursing assessment.   Brock BadHarper, Brucha Ahlquist A, MD 06/25/2018 3:47 PM

## 2018-07-09 ENCOUNTER — Ambulatory Visit: Payer: Medicaid Other | Admitting: Obstetrics and Gynecology

## 2018-07-23 ENCOUNTER — Encounter: Payer: Self-pay | Admitting: *Deleted

## 2018-07-23 ENCOUNTER — Ambulatory Visit (INDEPENDENT_AMBULATORY_CARE_PROVIDER_SITE_OTHER): Payer: Medicaid Other | Admitting: Obstetrics and Gynecology

## 2018-07-23 ENCOUNTER — Ambulatory Visit: Payer: Medicaid Other | Admitting: Obstetrics and Gynecology

## 2018-07-23 ENCOUNTER — Other Ambulatory Visit: Payer: Medicaid Other

## 2018-07-23 ENCOUNTER — Encounter: Payer: Self-pay | Admitting: Obstetrics and Gynecology

## 2018-07-23 VITALS — BP 161/117 | HR 83 | Wt 185.8 lb

## 2018-07-23 DIAGNOSIS — Z3042 Encounter for surveillance of injectable contraceptive: Secondary | ICD-10-CM

## 2018-07-23 DIAGNOSIS — O165 Unspecified maternal hypertension, complicating the puerperium: Secondary | ICD-10-CM

## 2018-07-23 DIAGNOSIS — Z3202 Encounter for pregnancy test, result negative: Secondary | ICD-10-CM | POA: Diagnosis not present

## 2018-07-23 DIAGNOSIS — Z1389 Encounter for screening for other disorder: Secondary | ICD-10-CM | POA: Diagnosis not present

## 2018-07-23 DIAGNOSIS — O24410 Gestational diabetes mellitus in pregnancy, diet controlled: Secondary | ICD-10-CM | POA: Diagnosis not present

## 2018-07-23 LAB — POCT URINE PREGNANCY: Preg Test, Ur: NEGATIVE

## 2018-07-23 MED ORDER — LABETALOL HCL 200 MG PO TABS: 400.0000 mg | ORAL_TABLET | Freq: Two times a day (BID) | ORAL | 2 refills | Status: DC

## 2018-07-23 MED ORDER — AMLODIPINE BESYLATE 10 MG PO TABS: 10.0000 mg | ORAL_TABLET | Freq: Every day | ORAL | 2 refills | Status: DC

## 2018-07-23 MED ORDER — MEDROXYPROGESTERONE ACETATE 150 MG/ML IM SUSP
150.0000 mg | Freq: Once | INTRAMUSCULAR | Status: AC
  Administered 2018-07-23: 150 mg via INTRAMUSCULAR

## 2018-07-23 NOTE — Addendum Note (Signed)
Addended byFrutoso Chase: [facility], Teshia Mahone on: 07/23/2018 08:58 AM   Modules accepted: Orders

## 2018-07-23 NOTE — Progress Notes (Signed)
Post Partum Exam  Tiffany Hoover is a 27 y.o. 153P2012 female who presents for a postpartum visit. She is 6 weeks postpartum following a spontaneous vaginal delivery. I have fully reviewed the prenatal and intrapartum course. The delivery was at 40 gestational weeks complicated by a 45-second shoulder dystocia.  Anesthesia: regional and epidural. Postpartum course has been unremarkable. Baby's course has been unremarkable. Baby is feeding by bottle - [employer] Good Start. Bleeding no bleeding. Bowel function is normal. Bladder function is normal. Patient is sexually active (last time was 2 weeks ago). Contraception method is none. Postpartum depression screening:neg (score:2)    Last pap smear done 11/26/17 and was Normal  Review of Systems Pertinent items are noted in HPI.    Objective:  Blood pressure (!) 161/117, pulse 83, weight 185 lb 12.8 oz (84.3 kg), unknown if currently breastfeeding.  General:  alert, cooperative and no distress   Breasts:  inspection negative, no nipple discharge or bleeding, no masses or nodularity palpable  Lungs: clear to auscultation bilaterally  Heart:  regular rate and rhythm  Abdomen: soft, non-tender; bowel sounds normal; no masses,  no organomegaly   Vulva:  normal  Vagina: normal vagina, no discharge, exudate, lesion, or erythema  Cervix:  multiparous appearance  Corpus: normal size, contour, position, consistency, mobility, non-tender  Adnexa:  normal adnexa and no mass, fullness, tenderness  Rectal Exam: Not performed.        Assessment:    Normal postpartum exam. Pap smear not done at today's visit.   Plan:   1. Contraception: Depo-Provera injections 2. Patient with elevated BP today. Patient did not take BP meds (norvasc and labetalol) this morning. Patient instructed to continue with BP meds (refills provided) and to follow up with PCP Postpartum glucola today Patient is medically cleared to resume all activities of daily living 3. Follow up  in: 6 months or as needed.

## 2018-07-24 LAB — GLUCOSE TOLERANCE, 2 HOURS
GLUCOSE, 2 HOUR: 98 mg/dL (ref 65–139)
Glucose, GTT - Fasting: 88 mg/dL (ref 65–99)

## 2018-07-27 ENCOUNTER — Encounter: Payer: Self-pay | Admitting: Obstetrics & Gynecology

## 2018-12-21 ENCOUNTER — Telehealth: Payer: Self-pay | Admitting: Family Medicine

## 2018-12-21 NOTE — Telephone Encounter (Signed)
Called in for pricing of Depo, informed 300.00 without insurance.

## 2019-02-15 ENCOUNTER — Encounter (HOSPITAL_COMMUNITY): Payer: Self-pay | Admitting: *Deleted

## 2019-02-15 ENCOUNTER — Emergency Department (HOSPITAL_COMMUNITY)
Admission: EM | Admit: 2019-02-15 | Discharge: 2019-02-15 | Disposition: A | Discharge status: Home / Self Care | Payer: Self-pay | Attending: Emergency Medicine | Admitting: Emergency Medicine

## 2019-02-15 DIAGNOSIS — N760 Acute vaginitis: Secondary | ICD-10-CM | POA: Insufficient documentation

## 2019-02-15 DIAGNOSIS — E119 Type 2 diabetes mellitus without complications: Secondary | ICD-10-CM | POA: Insufficient documentation

## 2019-02-15 DIAGNOSIS — B9689 Other specified bacterial agents as the cause of diseases classified elsewhere: Secondary | ICD-10-CM

## 2019-02-15 DIAGNOSIS — Z79899 Other long term (current) drug therapy: Secondary | ICD-10-CM | POA: Insufficient documentation

## 2019-02-15 LAB — WET PREP, GENITAL
Sperm: NONE SEEN
Trich, Wet Prep: NONE SEEN
Yeast Wet Prep HPF POC: NONE SEEN

## 2019-02-15 LAB — RPR: RPR Ser Ql: NONREACTIVE

## 2019-02-15 LAB — HIV ANTIBODY (ROUTINE TESTING W REFLEX): HIV Screen 4th Generation wRfx: NONREACTIVE

## 2019-02-15 MED ORDER — METRONIDAZOLE 500 MG PO TABS: 500.0000 mg | ORAL_TABLET | Freq: Two times a day (BID) | ORAL | 0 refills | Status: DC

## 2019-02-15 NOTE — ED Provider Notes (Signed)
[facility] EMERGENCY DEPARTMENT Provider Note   CSN: [id number] Arrival date & time: 02/15/19  0744    History   Chief Complaint Chief Complaint  Patient presents with  . Vaginal Discharge    HPI Tiffany Hoover is a 28 y.o. female who presents with vaginal discharge.  Past medical history significant for diabetes.  She states that she has been having vaginal discharge for the past 3 days.  It is white in color.  It makes her itchy.  She denies fever, chills, abdominal or pelvic pain, urinary symptoms.  She has not been sexually active recently.  She has normal periods.  She is requesting full STD screen.     HPI  Past Medical History:  Diagnosis Date  . Diabetes mellitus without complication (HCC)   . History of gestational diabetes 11/26/2017   negative postpartum GTT after 2019 pregnancy  . Preeclampsia 06/10/2018  . Shoulder dystocia during labor and delivery, delivered 06/10/2018   45-seconds    There are no active problems to display for this patient.   Past Surgical History:  Procedure Laterality Date  . NO PAST SURGERIES       OB History    Gravida  3   Para  2   Term  2   Preterm      AB  1   Living  2     SAB      TAB  1   Ectopic      Multiple  0   Live Births  2            Home Medications    Prior to Admission medications   Medication Sig Start Date End Date Taking? Authorizing Provider  amLODipine (NORVASC) 10 MG tablet Take 1 tablet (10 mg total) by mouth daily. 07/23/18   Constant, Peggy, MD  ibuprofen (ADVIL,MOTRIN) 600 MG tablet Take 1 tablet (600 mg total) by mouth every 6 (six) hours. Patient not taking: Reported on 07/23/2018 06/12/18   Constant, Peggy, MD  labetalol (NORMODYNE) 200 MG tablet Take 2 tablets (400 mg total) by mouth 2 (two) times daily. 07/23/18   Constant, Peggy, MD  metroNIDAZOLE (FLAGYL) 500 MG tablet Take 1 tablet (500 mg total) by mouth 2 (two) times daily. 02/15/19   Bethel Born, PA-C    Prenatal Vit-Fe Fumarate-FA (PREPLUS) 27-1 MG TABS Take 1 tablet by mouth daily. 11/26/17   Hermina Staggers, MD    Family History Family History  Problem Relation Age of Onset  . Diabetes Mother   . Hypertension Mother   . Hypertension Sister   . Diabetes Maternal Aunt   . Hypertension Maternal Aunt     Social History Social History   Tobacco Use  . Smoking status: Never Smoker  . Smokeless tobacco: Never Used  Substance Use Topics  . Alcohol use: No  . Drug use: No     Allergies   Patient has no known allergies.   Review of Systems Review of Systems  Constitutional: Negative for fever.  Gastrointestinal: Negative for abdominal pain, nausea and vomiting.  Genitourinary: Positive for vaginal discharge. Negative for dysuria, menstrual problem, pelvic pain and vaginal bleeding.  All other systems reviewed and are negative.    Physical Exam Updated Vital Signs BP (!) 148/91 (BP Location: Right Arm)   Pulse 66   Temp 98.2 F (36.8 C) (Oral)   Resp 16   SpO2 99%   Physical Exam Vitals signs and nursing note reviewed.  Constitutional:      General: She is not in acute distress.    Appearance: Normal appearance. She is well-developed.     Comments: Calm and cooperative  HENT:     Head: Normocephalic and atraumatic.  Eyes:     General: No scleral icterus.       Right eye: No discharge.        Left eye: No discharge.     Conjunctiva/sclera: Conjunctivae normal.     Pupils: Pupils are equal, round, and reactive to light.  Neck:     Musculoskeletal: Normal range of motion.  Cardiovascular:     Rate and Rhythm: Normal rate and regular rhythm.  Pulmonary:     Effort: Pulmonary effort is normal. No respiratory distress.     Breath sounds: Normal breath sounds.  Abdominal:     General: There is no distension.     Palpations: Abdomen is soft.     Tenderness: There is no abdominal tenderness.  Genitourinary:    Comments: Pelvic: No inguinal lymphadenopathy or  inguinal hernia noted. Normal external genitalia. No pain with speculum insertion. Closed cervical os with normal appearance - no rash or lesions. White and clear discharge in vaginal vault. On bimanual examination no adnexal tenderness or cervical motion tenderness. Chaperone present during exam.   Skin:    General: Skin is warm and dry.  Neurological:     Mental Status: She is alert and oriented to person, place, and time.  Psychiatric:        Behavior: Behavior normal.      ED Treatments / Results  Labs (all labs ordered are listed, but only abnormal results are displayed) Labs Reviewed  WET PREP, GENITAL - Abnormal; Notable for the following components:      Result Value   Clue Cells Wet Prep HPF POC PRESENT (*)    WBC, Wet Prep HPF POC MANY (*)    All other components within normal limits  RPR  HIV ANTIBODY (ROUTINE TESTING W REFLEX)  GC/CHLAMYDIA PROBE AMP ([place]) NOT AT [facility]    EKG None  Radiology No results found.  Procedures Procedures (including critical care time)  Medications Ordered in ED Medications - No data to display   Initial Impression / Assessment and Plan / ED Course  I have reviewed the triage vital signs and the nursing notes.  Pertinent labs & imaging results that were available during my care of the patient were reviewed by me and considered in my medical decision making (see chart for details).  28 year old female with vaginal discharge for the past couple of days.  She is mildly hypertensive but otherwise vital signs are normal.  Abdomen is soft and nontender.  Pelvic exam is remarkable for a moderate amount of white discharge.  There is no cervical motion or adnexal tenderness.  Wet prep shows clue cells.  We will treat for BV.  Full STD screen was sent.  She was encouraged to go to the health department or the [facility] gynecology clinic in the future for similar problems.  Final Clinical Impressions(s) / ED Diagnoses   Final diagnoses:   BV (bacterial vaginosis)    ED Discharge Orders         Ordered    metroNIDAZOLE (FLAGYL) 500 MG tablet  2 times daily     02/15/19 0909           Bethel Born, PA-C 02/15/19 5056    Jacalyn Lefevre, MD 02/15/19 [phone]

## 2019-02-15 NOTE — ED Notes (Signed)
Pt discharged from ED; instructions provided and scripts given; Pt encouraged to return to ED if symptoms worsen and to f/u with PCP; Pt verbalized understanding of all instructions 

## 2019-02-15 NOTE — ED Notes (Signed)
Signature pad not working. 

## 2019-02-15 NOTE — Discharge Instructions (Signed)
Take Flagyl twice a day for one week. Do not drink alcohol while taking this medicine Return if worsening

## 2019-02-15 NOTE — ED Triage Notes (Signed)
Pt in requesting STD check, she is c/o vaginal discharge and itching, no distress noted

## 2019-02-16 LAB — GC/CHLAMYDIA PROBE AMP (~~LOC~~) NOT AT ARMC
Chlamydia: NEGATIVE
Neisseria Gonorrhea: NEGATIVE

## 2020-02-04 ENCOUNTER — Emergency Department (HOSPITAL_BASED_OUTPATIENT_CLINIC_OR_DEPARTMENT_OTHER)
Admission: EM | Admit: 2020-02-04 | Discharge: 2020-02-04 | Disposition: A | Discharge status: Home / Self Care | Payer: Self-pay | Attending: Emergency Medicine | Admitting: Emergency Medicine

## 2020-02-04 ENCOUNTER — Other Ambulatory Visit: Payer: Self-pay

## 2020-02-04 ENCOUNTER — Encounter (HOSPITAL_BASED_OUTPATIENT_CLINIC_OR_DEPARTMENT_OTHER): Payer: Self-pay | Admitting: Emergency Medicine

## 2020-02-04 DIAGNOSIS — E119 Type 2 diabetes mellitus without complications: Secondary | ICD-10-CM | POA: Insufficient documentation

## 2020-02-04 DIAGNOSIS — R300 Dysuria: Secondary | ICD-10-CM | POA: Insufficient documentation

## 2020-02-04 DIAGNOSIS — Z79899 Other long term (current) drug therapy: Secondary | ICD-10-CM | POA: Insufficient documentation

## 2020-02-04 DIAGNOSIS — N898 Other specified noninflammatory disorders of vagina: Secondary | ICD-10-CM | POA: Insufficient documentation

## 2020-02-04 LAB — URINALYSIS, MICROSCOPIC (REFLEX)

## 2020-02-04 LAB — URINALYSIS, ROUTINE W REFLEX MICROSCOPIC
Bilirubin Urine: NEGATIVE
Glucose, UA: NEGATIVE mg/dL
Hgb urine dipstick: NEGATIVE
Ketones, ur: NEGATIVE mg/dL
Nitrite: NEGATIVE
Protein, ur: NEGATIVE mg/dL
Specific Gravity, Urine: <1.005 — ABNORMAL LOW (ref 1.005–1.030)
pH: 6.5 (ref 5.0–8.0)

## 2020-02-04 LAB — WET PREP, GENITAL
Clue Cells Wet Prep HPF POC: NONE SEEN
Sperm: NONE SEEN
Trich, Wet Prep: NONE SEEN
Yeast Wet Prep HPF POC: NONE SEEN

## 2020-02-04 LAB — PREGNANCY, URINE: Preg Test, Ur: NEGATIVE

## 2020-02-04 MED ORDER — METRONIDAZOLE 500 MG PO TABS
500.0000 mg | ORAL_TABLET | Freq: Once | ORAL | Status: AC
  Administered 2020-02-04: 500 mg via ORAL
  Filled 2020-02-04: qty 1

## 2020-02-04 MED ORDER — METRONIDAZOLE 500 MG PO TABS: 500.0000 mg | ORAL_TABLET | Freq: Two times a day (BID) | ORAL | 0 refills | Status: DC

## 2020-02-04 NOTE — ED Provider Notes (Signed)
[place] DEPT [facility] Provider Note: Georgena Spurling, MD, FACEP  CSN: [id number] MRN: [id number] ARRIVAL: 02/04/20 at [place]: [place]  Vaginal Discharge   HISTORY OF PRESENT ILLNESS  02/04/20 2:28 AM Tiffany Hoover is a 29 y.o. female complains of 4 to 5-day history of vaginal discharge with associated itching and abnormal (fishlike) odor.  She denies any associated pain.  She has had some mild dysuria.   Past Medical History:  Diagnosis Date  . Diabetes mellitus without complication ([place])   . History of gestational diabetes 11/26/2017   negative postpartum GTT after 2019 pregnancy  . Preeclampsia 06/10/2018  . Shoulder dystocia during labor and delivery, delivered 06/10/2018   45-seconds    Past Surgical History:  Procedure Laterality Date  . NO PAST SURGERIES      Family History  Problem Relation Age of Onset  . Diabetes Mother   . Hypertension Mother   . Hypertension Sister   . Diabetes Maternal Aunt   . Hypertension Maternal Aunt     Social History   Tobacco Use  . Smoking status: Never Smoker  . Smokeless tobacco: Never Used  Substance Use Topics  . Alcohol use: No  . Drug use: No    Prior to Admission medications   Medication Sig Start Date End Date Taking? Authorizing Provider  amLODipine (NORVASC) 10 MG tablet Take 1 tablet (10 mg total) by mouth daily. 07/23/18   Constant, Peggy, MD  labetalol (NORMODYNE) 200 MG tablet Take 2 tablets (400 mg total) by mouth 2 (two) times daily. 07/23/18   Constant, Peggy, MD  metroNIDAZOLE (FLAGYL) 500 MG tablet Take 1 tablet (500 mg total) by mouth 2 (two) times daily. 02/04/20   Advait Buice, MD  Prenatal Vit-Fe Fumarate-FA (PREPLUS) 27-1 MG TABS Take 1 tablet by mouth daily. 11/26/17   Chancy Milroy, MD    Allergies Patient has no known allergies.   REVIEW OF SYSTEMS  Negative except as noted here or in the History of Present Illness.   PHYSICAL EXAMINATION  Initial Vital Signs Blood  pressure 130/78, pulse 76, temperature 98.3 F (36.8 C), temperature source Oral, resp. rate 16, height 5\' 4"  (1.626 m), weight 75.3 kg, last menstrual period 01/26/2020, SpO2 100 %, unknown if currently breastfeeding.  Examination General: Well-developed, well-nourished female in no acute distress; appearance consistent with age of record HENT: normocephalic; atraumatic Eyes: pupils equal, round and reactive to light; extraocular muscles intact Neck: supple Heart: regular rate and rhythm Lungs: clear to auscultation bilaterally Abdomen: soft; nondistended; nontender; bowel sounds present GU: Normal external genitalia; thick, yellow vaginal discharge; no vaginal bleeding; no cervical motion tenderness; no adnexal tenderness Extremities: No deformity; full range of motion; pulses normal Neurologic: Awake, alert and oriented; motor function intact in all extremities and symmetric; no facial droop Skin: Warm and dry Psychiatric: Normal mood and affect   RESULTS  Summary of this visit's results, reviewed and interpreted by myself:   EKG Interpretation  Date/Time:    Ventricular Rate:    PR Interval:    QRS Duration:   QT Interval:    QTC Calculation:   R Axis:     Text Interpretation:        Laboratory Studies: Results for orders placed or performed during the hospital encounter of 02/04/20 (from the past 24 hour(s))  Urinalysis, Routine w reflex microscopic     Status: Abnormal   Collection Time: 02/04/20  1:54 AM  Result Value Ref Range  Color, Urine YELLOW YELLOW   APPearance CLEAR CLEAR   Specific Gravity, Urine <1.005 (L) 1.005 - 1.030   pH 6.5 5.0 - 8.0   Glucose, UA NEGATIVE NEGATIVE mg/dL   Hgb urine dipstick NEGATIVE NEGATIVE   Bilirubin Urine NEGATIVE NEGATIVE   Ketones, ur NEGATIVE NEGATIVE mg/dL   Protein, ur NEGATIVE NEGATIVE mg/dL   Nitrite NEGATIVE NEGATIVE   Leukocytes,Ua TRACE (A) NEGATIVE  Pregnancy, urine     Status: None   Collection Time: 02/04/20   1:54 AM  Result Value Ref Range   Preg Test, Ur NEGATIVE NEGATIVE  Urinalysis, Microscopic (reflex)     Status: Abnormal   Collection Time: 02/04/20  1:54 AM  Result Value Ref Range   RBC / HPF 0-5 0 - 5 RBC/hpf   WBC, UA 0-5 0 - 5 WBC/hpf   Bacteria, UA RARE (A) NONE SEEN   Squamous Epithelial / LPF 0-5 0 - 5  Wet prep, genital     Status: Abnormal   Collection Time: 02/04/20  2:40 AM  Result Value Ref Range   Yeast Wet Prep HPF POC NONE SEEN NONE SEEN   Trich, Wet Prep NONE SEEN NONE SEEN   Clue Cells Wet Prep HPF POC NONE SEEN NONE SEEN   WBC, Wet Prep HPF POC MANY (A) NONE SEEN   Sperm NONE SEEN    Imaging Studies: No results found.  ED COURSE and MDM  Nursing notes, initial and subsequent vitals signs, including pulse oximetry, reviewed and interpreted by myself.  Vitals:   02/04/20 0145 02/04/20 0146  BP: 130/78   Pulse: 76   Resp: 16   Temp: 98.3 F (36.8 C)   TempSrc: Oral   SpO2: 100%   Weight:  75.3 kg  Height:  5\' 4"  (1.626 m)   Medications  metroNIDAZOLE (FLAGYL) tablet 500 mg (has no administration in time range)    The patient's wet prep is negative but given the yellow discharge with vomiting odor we will treat for bacterial vaginosis which should also cover trichomoniasis.  Gonorrhea and Chlamydia testing are pending.  PROCEDURES  Procedures   ED DIAGNOSES     ICD-10-CM   1. Vaginal discharge  N89.8        , MD 02/04/20 [phone]

## 2020-02-04 NOTE — ED Triage Notes (Signed)
Patient arrived via POV c/o vaginal discharge with itching and odor x 4-5 days. Patient is AO x 4, VS wdl, normal gait.

## 2020-02-05 LAB — GC/CHLAMYDIA PROBE AMP (~~LOC~~) NOT AT ARMC
Chlamydia: NEGATIVE
Neisseria Gonorrhea: NEGATIVE

## 2020-03-09 ENCOUNTER — Other Ambulatory Visit: Payer: Self-pay

## 2020-03-09 ENCOUNTER — Emergency Department (HOSPITAL_BASED_OUTPATIENT_CLINIC_OR_DEPARTMENT_OTHER)
Admission: EM | Admit: 2020-03-09 | Discharge: 2020-03-09 | Disposition: A | Discharge status: Home / Self Care | Payer: Self-pay | Attending: Emergency Medicine | Admitting: Emergency Medicine

## 2020-03-09 ENCOUNTER — Encounter (HOSPITAL_BASED_OUTPATIENT_CLINIC_OR_DEPARTMENT_OTHER): Payer: Self-pay | Admitting: Oncology

## 2020-03-09 DIAGNOSIS — N898 Other specified noninflammatory disorders of vagina: Secondary | ICD-10-CM | POA: Insufficient documentation

## 2020-03-09 DIAGNOSIS — E119 Type 2 diabetes mellitus without complications: Secondary | ICD-10-CM | POA: Insufficient documentation

## 2020-03-09 LAB — PREGNANCY, URINE: Preg Test, Ur: NEGATIVE

## 2020-03-09 LAB — WET PREP, GENITAL
Clue Cells Wet Prep HPF POC: NONE SEEN
Sperm: NONE SEEN
Trich, Wet Prep: NONE SEEN
Yeast Wet Prep HPF POC: NONE SEEN

## 2020-03-09 MED ORDER — METRONIDAZOLE 500 MG PO TABS: 500.0000 mg | ORAL_TABLET | Freq: Two times a day (BID) | ORAL | 0 refills | Status: DC

## 2020-03-09 NOTE — ED Provider Notes (Signed)
[place] EMERGENCY DEPARTMENT Provider Note   CSN: [id number] Arrival date & time: 03/09/20  0343     History Chief Complaint  Patient presents with  . Vaginal Discharge    Tiffany Hoover is a 29 y.o. female.  29 yo F with a chief complaint of vaginal discharge.  Going on for about 4 days.  Having some pelvic discomfort with it as well.  No fevers no one-sided pain.  Denies likelihood of being pregnant.  Denies new soaps or detergents denies new sexual partner.  The history is provided by the patient.  Vaginal Discharge Quality:  Thick and white Severity:  Moderate Onset quality:  Gradual Duration:  4 days Timing:  Constant Progression:  Unchanged Chronicity:  Recurrent Relieved by:  Nothing Worsened by:  Nothing Ineffective treatments:  None tried Associated symptoms: vaginal itching   Associated symptoms: no dysuria, no fever, no nausea and no vomiting   Risk factors: no foreign body, no new sexual partner, no STI and no STI exposure        Past Medical History:  Diagnosis Date  . Diabetes mellitus without complication ([place])   . History of gestational diabetes 11/26/2017   negative postpartum GTT after 2019 pregnancy  . Preeclampsia 06/10/2018  . Shoulder dystocia during labor and delivery, delivered 06/10/2018   45-seconds    There are no problems to display for this patient.   Past Surgical History:  Procedure Laterality Date  . NO PAST SURGERIES       OB History    Gravida  3   Para  2   Term  2   Preterm      AB  1   Living  2     SAB      TAB  1   Ectopic      Multiple  0   Live Births  2           Family History  Problem Relation Age of Onset  . Diabetes Mother   . Hypertension Mother   . Hypertension Sister   . Diabetes Maternal Aunt   . Hypertension Maternal Aunt     Social History   Tobacco Use  . Smoking status: Never Smoker  . Smokeless tobacco: Never Used  Substance Use Topics  . Alcohol use: No    . Drug use: No    Home Medications Prior to Admission medications   Medication Sig Start Date End Date Taking? Authorizing Provider  amLODipine (NORVASC) 10 MG tablet Take 1 tablet (10 mg total) by mouth daily. 07/23/18   Constant, Peggy, MD  labetalol (NORMODYNE) 200 MG tablet Take 2 tablets (400 mg total) by mouth 2 (two) times daily. 07/23/18   Constant, Peggy, MD  metroNIDAZOLE (FLAGYL) 500 MG tablet Take 1 tablet (500 mg total) by mouth 2 (two) times daily. 03/09/20   Deno Etienne, DO  Prenatal Vit-Fe Fumarate-FA (PREPLUS) 27-1 MG TABS Take 1 tablet by mouth daily. 11/26/17   Chancy Milroy, MD    Allergies    Patient has no known allergies.  Review of Systems   Review of Systems  Constitutional: Negative for chills and fever.  HENT: Negative for congestion and rhinorrhea.   Eyes: Negative for redness and visual disturbance.  Respiratory: Negative for shortness of breath and wheezing.   Cardiovascular: Negative for chest pain and palpitations.  Gastrointestinal: Negative for nausea and vomiting.  Genitourinary: Positive for pelvic pain and vaginal discharge. Negative for dysuria and urgency.  Musculoskeletal: Negative for arthralgias and myalgias.  Skin: Negative for pallor and wound.  Neurological: Negative for dizziness and headaches.    Physical Exam Updated Vital Signs BP 124/76 (BP Location: Right Arm)   Pulse 81   Temp 98.4 F (36.9 C) (Oral)   Resp 18   Ht 5\' 3"  (1.6 m)   Wt 75.8 kg   LMP 02/19/2020 (Approximate)   SpO2 100%   BMI 29.58 kg/m   Physical Exam Vitals and nursing note reviewed.  Constitutional:      General: She is not in acute distress.    Appearance: She is well-developed. She is not diaphoretic.  HENT:     Head: Normocephalic and atraumatic.  Eyes:     Pupils: Pupils are equal, round, and reactive to light.  Cardiovascular:     Rate and Rhythm: Normal rate and regular rhythm.     Heart sounds: No murmur. No friction rub. No gallop.    Pulmonary:     Effort: Pulmonary effort is normal.     Breath sounds: No wheezing or rales.  Abdominal:     General: There is no distension.     Palpations: Abdomen is soft.     Tenderness: There is no abdominal tenderness.  Genitourinary:    Comments: Copious thick white discharge.  Cervix appears normal.  No cervical motion tenderness.  No adnexal tenderness or masses. Musculoskeletal:        General: No tenderness.     Cervical back: Normal range of motion and neck supple.  Skin:    General: Skin is warm and dry.  Neurological:     Mental Status: She is alert and oriented to person, place, and time.  Psychiatric:        Behavior: Behavior normal.     ED Results / Procedures / Treatments   Labs (all labs ordered are listed, but only abnormal results are displayed) Labs Reviewed  WET PREP, GENITAL - Abnormal; Notable for the following components:      Result Value   WBC, Wet Prep HPF POC MODERATE (*)    All other components within normal limits  PREGNANCY, URINE  GC/CHLAMYDIA PROBE AMP ([place]) NOT AT [facility]    EKG None  Radiology No results found.  Procedures Procedures (including critical care time)  Medications Ordered in ED Medications - No data to display  ED Course  I have reviewed the triage vital signs and the nursing notes.  Pertinent labs & imaging results that were available during my care of the patient were reviewed by me and considered in my medical decision making (see chart for details).    MDM Rules/Calculators/A&P                      29 yo F with a chief complaint of vaginal discharge.  Seen here about a month ago with the same.  We will send off wet prep.  Wet prep is negative.  Patient is not pregnant.  This is similar to her last visit.  She was treated with antibiotics and had resolution of her symptoms.  We will repeat a trial of antibiotics.  I suggest that she follow-up with OB/GYN for further evaluation.  4:35 AM:  I have  discussed the diagnosis/risks/treatment options with the patient and believe the pt to be eligible for discharge home to follow-up with GYN. We also discussed returning to the ED immediately if new or worsening sx occur. We discussed the sx which  are most concerning (e.g., sudden worsening pain, fever, inability to tolerate by mouth) that necessitate immediate return. Medications administered to the patient during their visit and any new prescriptions provided to the patient are listed below.  Medications given during this visit Medications - No data to display   The patient appears reasonably screen and/or stabilized for discharge and I doubt any other medical condition or other [facility] requiring further screening, evaluation, or treatment in the ED at this time prior to discharge.   Final Clinical Impression(s) / ED Diagnoses Final diagnoses:  Vaginal discharge    Rx / DC Orders ED Discharge Orders         Ordered    metroNIDAZOLE (FLAGYL) 500 MG tablet  2 times daily     03/09/20 0433           Melene Plan, DO 03/09/20 [phone]

## 2020-03-09 NOTE — Discharge Instructions (Signed)
The lab test today did not show an obvious cause of your symptoms.  Has improved last time with antibiotics to try that again this time.  Please follow-up with an OB/GYN to further evaluate your symptoms and see if there is another possibility of the cause of your discharge.

## 2020-03-09 NOTE — ED Triage Notes (Signed)
Pt c/o white foul smelling discharge from vagina x 5 days.  +Itching.  Denies recent unprotected sex.  States had antibiotics approximately one month ago. Also c/o lower abdominal pain.

## 2020-03-10 LAB — GC/CHLAMYDIA PROBE AMP (~~LOC~~) NOT AT ARMC
Chlamydia: NEGATIVE
Neisseria Gonorrhea: NEGATIVE

## 2020-08-31 ENCOUNTER — Encounter (HOSPITAL_COMMUNITY): Payer: Self-pay | Admitting: Emergency Medicine

## 2020-08-31 ENCOUNTER — Emergency Department (HOSPITAL_COMMUNITY)
Admission: EM | Admit: 2020-08-31 | Discharge: 2020-09-01 | Disposition: A | Discharge status: Home / Self Care | Payer: Self-pay | Attending: Emergency Medicine | Admitting: Emergency Medicine

## 2020-08-31 DIAGNOSIS — Z5321 Procedure and treatment not carried out due to patient leaving prior to being seen by health care provider: Secondary | ICD-10-CM | POA: Insufficient documentation

## 2020-08-31 DIAGNOSIS — N898 Other specified noninflammatory disorders of vagina: Secondary | ICD-10-CM | POA: Insufficient documentation

## 2020-08-31 DIAGNOSIS — R1030 Lower abdominal pain, unspecified: Secondary | ICD-10-CM | POA: Insufficient documentation

## 2020-08-31 LAB — URINALYSIS, ROUTINE W REFLEX MICROSCOPIC
Bilirubin Urine: NEGATIVE
Glucose, UA: NEGATIVE mg/dL
Hgb urine dipstick: NEGATIVE
Ketones, ur: NEGATIVE mg/dL
Leukocytes,Ua: NEGATIVE
Nitrite: NEGATIVE
Protein, ur: 30 mg/dL — AB
Specific Gravity, Urine: 1.031 — ABNORMAL HIGH (ref 1.005–1.030)
pH: 6 (ref 5.0–8.0)

## 2020-08-31 LAB — PREGNANCY, URINE: Preg Test, Ur: NEGATIVE

## 2020-08-31 NOTE — ED Triage Notes (Signed)
Pt here with c/o low abd pain and vag. Discharge , pt has hx of BV

## 2020-08-31 NOTE — ED Notes (Signed)
Pt left stated we can take her off the list.

## 2020-09-01 ENCOUNTER — Ambulatory Visit
Admission: EM | Admit: 2020-09-01 | Discharge: 2020-09-01 | Disposition: A | Discharge status: Home / Self Care | Payer: Self-pay | Attending: Physician Assistant | Admitting: Physician Assistant

## 2020-09-01 ENCOUNTER — Other Ambulatory Visit: Payer: Self-pay

## 2020-09-01 VITALS — BP 121/80 | HR 67 | Temp 98.3°F | Resp 18

## 2020-09-01 DIAGNOSIS — Z202 Contact with and (suspected) exposure to infections with a predominantly sexual mode of transmission: Secondary | ICD-10-CM | POA: Insufficient documentation

## 2020-09-01 DIAGNOSIS — N898 Other specified noninflammatory disorders of vagina: Secondary | ICD-10-CM | POA: Insufficient documentation

## 2020-09-01 LAB — POCT URINALYSIS DIP (MANUAL ENTRY)
Bilirubin, UA: NEGATIVE
Blood, UA: NEGATIVE
Glucose, UA: NEGATIVE mg/dL
Ketones, POC UA: NEGATIVE mg/dL
Leukocytes, UA: NEGATIVE
Nitrite, UA: NEGATIVE
Protein Ur, POC: 30 mg/dL — AB
Spec Grav, UA: 1.025 (ref 1.010–1.025)
Urobilinogen, UA: 1 E.U./dL
pH, UA: 8 (ref 5.0–8.0)

## 2020-09-01 LAB — POCT URINE PREGNANCY: Preg Test, Ur: NEGATIVE

## 2020-09-01 MED ORDER — DOXYCYCLINE HYCLATE 100 MG PO CAPS: 100.0000 mg | ORAL_CAPSULE | Freq: Two times a day (BID) | ORAL | 0 refills | Status: AC

## 2020-09-01 MED ORDER — FLUCONAZOLE 150 MG PO TABS: 150.0000 mg | ORAL_TABLET | Freq: Once | ORAL | 0 refills | Status: AC

## 2020-09-01 MED ORDER — CEFTRIAXONE SODIUM 500 MG IJ SOLR
500.0000 mg | Freq: Once | INTRAMUSCULAR | Status: AC
  Administered 2020-09-01: 500 mg via INTRAMUSCULAR

## 2020-09-01 MED ORDER — METRONIDAZOLE 500 MG PO TABS: 500.0000 mg | ORAL_TABLET | Freq: Two times a day (BID) | ORAL | 0 refills | Status: AC

## 2020-09-01 NOTE — ED Provider Notes (Signed)
[name]    CSN: [id number] Arrival date & time: 09/01/20  2505      History   Chief Complaint Chief Complaint  Patient presents with  . SEXUALLY TRANSMITTED DISEASE    symptoms x 4 days    HPI Tiffany Hoover is a 29 y.o. female.   29 year old female presents with unusual thick white vaginal discharge with foul odor, lower abdominal pain for the past 4 days. Pain is mainly on right side. Denies any fever, dysuria, unusual vaginal bleeding or hematuria. Has not taken any medication for symptoms. Currently has one partner and uses condoms most of the time. Went to [facility] ER last night for evaluation but had to leave after 4 to 5 hours to take care of her kids. Had urine pregnancy test last night that was negative. Also urinalysis showed positive protein and a few bacteria but no WBC's or nitrites. Requests testing for Vaginitis and STD's today. No other chronic health issues. Takes no daily medication.   The history is provided by the patient.    Past Medical History:  Diagnosis Date  . Diabetes mellitus without complication (HCC)   . History of gestational diabetes 11/26/2017   negative postpartum GTT after 2019 pregnancy  . Preeclampsia 06/10/2018  . Shoulder dystocia during labor and delivery, delivered 06/10/2018   45-seconds    There are no problems to display for this patient.   Past Surgical History:  Procedure Laterality Date  . NO PAST SURGERIES      OB History    Gravida  3   Para  2   Term  2   Preterm      AB  1   Living  2     SAB      TAB  1   Ectopic      Multiple  0   Live Births  2            Home Medications    Prior to Admission medications   Medication Sig Start Date End Date Taking? Authorizing Provider  doxycycline (VIBRAMYCIN) 100 MG capsule Take 1 capsule (100 mg total) by mouth 2 (two) times daily for 7 days. 09/01/20 09/08/20  Sudie Grumbling, NP  fluconazole (DIFLUCAN) 150 MG tablet Take 1 tablet  (150 mg total) by mouth once for 1 dose. Repeat 1 tablet in 3 days if needed. 09/01/20 09/01/20  Sudie Grumbling, NP  metroNIDAZOLE (FLAGYL) 500 MG tablet Take 1 tablet (500 mg total) by mouth 2 (two) times daily for 7 days. 09/01/20 09/08/20  Sudie Grumbling, NP  amLODipine (NORVASC) 10 MG tablet Take 1 tablet (10 mg total) by mouth daily. 07/23/18 09/01/20  Constant, Peggy, MD  labetalol (NORMODYNE) 200 MG tablet Take 2 tablets (400 mg total) by mouth 2 (two) times daily. 07/23/18 09/01/20  Constant, Peggy, MD    Family History Family History  Problem Relation Age of Onset  . Diabetes Mother   . Hypertension Mother   . Hypertension Sister   . Diabetes Maternal Aunt   . Hypertension Maternal Aunt     Social History Social History   Tobacco Use  . Smoking status: Never Smoker  . Smokeless tobacco: Never Used  Vaping Use  . Vaping Use: Never used  Substance Use Topics  . Alcohol use: No  . Drug use: No     Allergies   Patient has no known allergies.   Review of Systems Review of Systems  Constitutional: Negative for activity change, chills, fatigue and fever.  HENT: Negative for mouth sores and sore throat.   Respiratory: Negative for cough and chest tightness.   Gastrointestinal: Positive for abdominal pain. Negative for constipation, diarrhea and vomiting.  Genitourinary: Positive for pelvic pain and vaginal discharge. Negative for decreased urine volume, difficulty urinating, dysuria, flank pain, frequency, hematuria, urgency and vaginal bleeding.  Musculoskeletal: Negative for arthralgias and myalgias.  Skin: Negative for color change and rash.  Allergic/Immunologic: Negative for environmental allergies, food allergies and immunocompromised state.  Neurological: Negative for dizziness, seizures, syncope, light-headedness and headaches.  Hematological: Negative for adenopathy. Does not bruise/bleed easily.     Physical Exam Triage Vital Signs ED Triage Vitals  Enc Vitals  Group     BP 09/01/20 1015 121/80     Pulse Rate 09/01/20 1015 67     Resp 09/01/20 1015 18     Temp 09/01/20 1015 98.3 F (36.8 C)     Temp Source 09/01/20 1015 Oral     SpO2 09/01/20 1015 99 %     Weight --      Height --      Head Circumference --      Peak Flow --      Pain Score 09/01/20 1017 0     Pain Loc --      Pain Edu? --      Excl. in GC? --    No data found.  Updated Vital Signs BP 121/80 (BP Location: Right Arm)   Pulse 67   Temp 98.3 F (36.8 C) (Oral)   Resp 18   LMP 08/05/2020 (Approximate)   SpO2 99%   Visual Acuity Right Eye Distance:   Left Eye Distance:   Bilateral Distance:    Right Eye Near:   Left Eye Near:    Bilateral Near:     Physical Exam Vitals and nursing note reviewed.  Constitutional:      General: She is awake. She is not in acute distress.    Appearance: Normal appearance. She is well-developed and well-groomed.     Comments: She is sitting on the exam table in no acute distress.   HENT:     Head: Normocephalic and atraumatic.  Eyes:     Extraocular Movements: Extraocular movements intact.     Conjunctiva/sclera: Conjunctivae normal.  Cardiovascular:     Rate and Rhythm: Normal rate and regular rhythm.     Heart sounds: Normal heart sounds. No murmur heard.   Pulmonary:     Effort: Pulmonary effort is normal. No respiratory distress.     Breath sounds: Normal breath sounds and air entry. No decreased air movement. No decreased breath sounds, wheezing, rhonchi or rales.  Abdominal:     General: Bowel sounds are normal. There is no distension.     Palpations: Abdomen is soft. There is no hepatomegaly or splenomegaly.     Tenderness: There is abdominal tenderness in the right lower quadrant. There is no right CVA tenderness or left CVA tenderness.  Genitourinary:    Comments: Patient declined pelvic exam. Patient obtained vaginal self-swab for testing.  Musculoskeletal:        General: Normal range of motion.     Cervical  back: Normal range of motion.  Skin:    General: Skin is warm and dry.     Findings: No rash.  Neurological:     General: No focal deficit present.     Mental Status: She is alert and oriented  to person, place, and time.  Psychiatric:        Mood and Affect: Mood normal.        Behavior: Behavior normal. Behavior is cooperative.        Thought Content: Thought content normal.        Judgment: Judgment normal.      UC Treatments / Results  Labs (all labs ordered are listed, but only abnormal results are displayed) Labs Reviewed  POCT URINALYSIS DIP (MANUAL ENTRY) - Abnormal; Notable for the following components:      Result Value   Protein Ur, POC =30 (*)    All other components within normal limits  POCT URINE PREGNANCY  CERVICOVAGINAL ANCILLARY ONLY    EKG   Radiology No results found.  Procedures Procedures (including critical care time)  Medications Ordered in UC Medications  cefTRIAXone (ROCEPHIN) injection 500 mg (500 mg Intramuscular Given 09/01/20 1118)    Initial Impression / Assessment and Plan / UC Course  I have reviewed the triage vital signs and the nursing notes.  Pertinent labs & imaging results that were available during my care of the patient were reviewed by me and considered in my medical decision making (see chart for details).    Reviewed negative urine pregnancy with patient. Reviewed urinalysis results- only positive for protein- doubt UTI. Discussed possible pelvic and vaginal infections. Patient requests treatment for GC/Chlamydia today as well as for possible BV and yeast. Gave Rocephin 500mg  IM now. Start Doxycycline 100mg  twice a day as directed. May start Flagyl 500mg  twice a day for 7 days- no alcohol. May take Diflucan 150mg  tomorrow and repeat 1 tablet in 3 days if needed. No sexual intercourse for at least 7 days. Encouraged to use condoms with each and every future sexual encounter. Follow-up pending lab results.  Final Clinical  Impressions(s) / UC Diagnoses   Final diagnoses:  Vaginal discharge  Vaginal odor  Potential exposure to STD     Discharge Instructions     You were given a shot today for possible Gonorrhea. Recommend start Doxycycline 100mg  twice a day as directed for possible Chlamydia. Also start Flagyl 500mg  twice a day for possible Bacterial Vaginosis (BV). May take Diflucan 150mg  tablet once tomorrow, then repeat 1 tablet in 3 days to prevent antibiotic associated yeast infection. No sexual intercourse for at least 7 days. Encouraged to use condoms with each and every future sexual encounter. Follow-up pending lab results.     ED Prescriptions    Medication Sig Dispense Auth. Provider   doxycycline (VIBRAMYCIN) 100 MG capsule Take 1 capsule (100 mg total) by mouth 2 (two) times daily for 7 days. 14 capsule , NP   metroNIDAZOLE (FLAGYL) 500 MG tablet Take 1 tablet (500 mg total) by mouth 2 (two) times daily for 7 days. 14 tablet , NP   fluconazole (DIFLUCAN) 150 MG tablet Take 1 tablet (150 mg total) by mouth once for 1 dose. Repeat 1 tablet in 3 days if needed. 2 tablet Telsa Dillavou, , NP     PDMP not reviewed this encounter.   , NP 09/01/20 2138

## 2020-09-01 NOTE — Discharge Instructions (Signed)
You were given a shot today for possible Gonorrhea. Recommend start Doxycycline 100mg  twice a day as directed for possible Chlamydia. Also start Flagyl 500mg  twice a day for possible Bacterial Vaginosis (BV). May take Diflucan 150mg  tablet once tomorrow, then repeat 1 tablet in 3 days to prevent antibiotic associated yeast infection. No sexual intercourse for at least 7 days. Encouraged to use condoms with each and every future sexual encounter. Follow-up pending lab results.

## 2020-09-01 NOTE — ED Triage Notes (Signed)
Pt states she has been experiencing vaginal discharge, lower abdominal pain, and foul odor x 4 days. Pt went to ER at [facility] but had to leave before being seen to pick up her children. Pt is aox4 and ambulatory.

## 2020-09-04 LAB — CERVICOVAGINAL ANCILLARY ONLY
Bacterial Vaginitis (gardnerella): POSITIVE — AB
Candida Glabrata: NEGATIVE
Candida Vaginitis: NEGATIVE
Chlamydia: NEGATIVE
Comment: NEGATIVE
Comment: NEGATIVE
Comment: NEGATIVE
Comment: NEGATIVE
Comment: NEGATIVE
Comment: NORMAL
Neisseria Gonorrhea: NEGATIVE
Trichomonas: NEGATIVE

## 2020-09-06 ENCOUNTER — Telehealth (HOSPITAL_COMMUNITY): Payer: Self-pay | Admitting: Emergency Medicine

## 2021-10-25 ENCOUNTER — Ambulatory Visit
Admission: EM | Admit: 2021-10-25 | Discharge: 2021-10-25 | Disposition: A | Discharge status: Home / Self Care | Payer: Self-pay | Attending: Internal Medicine | Admitting: Internal Medicine

## 2021-10-25 ENCOUNTER — Encounter: Payer: Self-pay | Admitting: Emergency Medicine

## 2021-10-25 ENCOUNTER — Other Ambulatory Visit: Payer: Self-pay

## 2021-10-25 DIAGNOSIS — N898 Other specified noninflammatory disorders of vagina: Secondary | ICD-10-CM

## 2021-10-25 DIAGNOSIS — Z113 Encounter for screening for infections with a predominantly sexual mode of transmission: Secondary | ICD-10-CM

## 2021-10-25 NOTE — Discharge Instructions (Signed)
Your vaginal swab is pending.  We will call if it is positive and treat as appropriate.

## 2021-10-25 NOTE — ED Triage Notes (Signed)
Patient c/o vaginal discharge x 3 days, yellowish in color and odor.  Patient not concerned for STI.

## 2021-10-25 NOTE — ED Provider Notes (Signed)
[facility]    CSN: [id number] Arrival date & time: 10/25/21  0933      History   Chief Complaint Chief Complaint  Patient presents with   Vaginal Discharge    HPI Tiffany Hoover is a 30 y.o. female.   Patient presents with 3-day history of yellow vaginal discharge.  Denies any urinary burning, urinary frequency, pelvic pain, hematuria, irregular vaginal bleeding, abdominal pain, fever, back pain.  Denies any known exposure to STD but has had unprotected sexual intercourse recently.  Last menstrual cycle completed approximately 2 days ago.   Vaginal Discharge  Past Medical History:  Diagnosis Date   Diabetes mellitus without complication ([place])    History of gestational diabetes 11/26/2017   negative postpartum GTT after 2019 pregnancy   Preeclampsia 06/10/2018   Shoulder dystocia during labor and delivery, delivered 06/10/2018   45-seconds    There are no problems to display for this patient.   Past Surgical History:  Procedure Laterality Date   NO PAST SURGERIES      OB History     Gravida  3   Para  2   Term  2   Preterm      AB  1   Living  2      SAB      IAB  1   Ectopic      Multiple  0   Live Births  2            Home Medications    Prior to Admission medications   Medication Sig Start Date End Date Taking? Authorizing Provider  amLODipine (NORVASC) 10 MG tablet Take 1 tablet (10 mg total) by mouth daily. 07/23/18 09/01/20  Constant, Peggy, MD  labetalol (NORMODYNE) 200 MG tablet Take 2 tablets (400 mg total) by mouth 2 (two) times daily. 07/23/18 09/01/20  Constant, Peggy, MD    Family History Family History  Problem Relation Age of Onset   Diabetes Mother    Hypertension Mother    Hypertension Sister    Diabetes Maternal Aunt    Hypertension Maternal Aunt     Social History Social History   Tobacco Use   Smoking status: Never   Smokeless tobacco: Never  Vaping Use   Vaping Use: Never used  Substance Use  Topics   Alcohol use: No   Drug use: No     Allergies   Patient has no known allergies.   Review of Systems Review of Systems Per HPI  Physical Exam Triage Vital Signs ED Triage Vitals  Enc Vitals Group     BP 10/25/21 1200 135/90     Pulse Rate 10/25/21 1200 78     Resp 10/25/21 1200 18     Temp 10/25/21 1200 98 F (36.7 C)     Temp Source 10/25/21 1200 Oral     SpO2 10/25/21 1200 98 %     Weight 10/25/21 1201 167 lb (75.8 kg)     Height 10/25/21 1201 5\' 4"  (1.626 m)     Head Circumference --      Peak Flow --      Pain Score 10/25/21 1200 5     Pain Loc --      Pain Edu? --      Excl. in [place]? --    No data found.  Updated Vital Signs BP 135/90 (BP Location: Left Arm)   Pulse 78   Temp 98 F (36.7 C) (Oral)   Resp 18  Ht 5\' 4"  (1.626 m)   Wt 167 lb (75.8 kg)   LMP 10/16/2021   SpO2 98%   Breastfeeding No   BMI 28.67 kg/m   Visual Acuity Right Eye Distance:   Left Eye Distance:   Bilateral Distance:    Right Eye Near:   Left Eye Near:    Bilateral Near:     Physical Exam Constitutional:      General: She is not in acute distress.    Appearance: Normal appearance. She is not toxic-appearing or diaphoretic.  HENT:     Head: Normocephalic and atraumatic.  Eyes:     Extraocular Movements: Extraocular movements intact.     Conjunctiva/sclera: Conjunctivae normal.  Pulmonary:     Effort: Pulmonary effort is normal.  Genitourinary:    Comments: Deferred with shared decision making.  Self swab performed. Neurological:     General: No focal deficit present.     Mental Status: She is alert and oriented to person, place, and time. Mental status is at baseline.  Psychiatric:        Mood and Affect: Mood normal.        Behavior: Behavior normal.        Thought Content: Thought content normal.        Judgment: Judgment normal.     UC Treatments / Results  Labs (all labs ordered are listed, but only abnormal results are displayed) Labs Reviewed   CERVICOVAGINAL ANCILLARY ONLY    EKG   Radiology No results found.  Procedures Procedures (including critical care time)  Medications Ordered in UC Medications - No data to display  Initial Impression / Assessment and Plan / UC Course  I have reviewed the triage vital signs and the nursing notes.  Pertinent labs & imaging results that were available during my care of the patient were reviewed by me and considered in my medical decision making (see chart for details).     Cervicovaginal swab pending.  Will await results for treatment.  Do not think urinalysis is necessary given that there are no urinary symptoms.  Patient to refrain from sexual activity until test results and treatment are complete.  Discussed strict return precautions.  Patient verbalized understanding and was agreeable with plan. Final Clinical Impressions(s) / UC Diagnoses   Final diagnoses:  Vaginal discharge  Screening examination for venereal disease     Discharge Instructions      Your vaginal swab is pending.  We will call if it is positive and treat as appropriate.     ED Prescriptions   None    PDMP not reviewed this encounter.   13/12/2020, [name] 10/25/21 1253

## 2021-10-26 ENCOUNTER — Telehealth (HOSPITAL_COMMUNITY): Payer: Self-pay | Admitting: Emergency Medicine

## 2021-10-26 LAB — CERVICOVAGINAL ANCILLARY ONLY
Bacterial Vaginitis (gardnerella): POSITIVE — AB
Candida Glabrata: NEGATIVE
Candida Vaginitis: NEGATIVE
Chlamydia: NEGATIVE
Comment: NEGATIVE
Comment: NEGATIVE
Comment: NEGATIVE
Comment: NEGATIVE
Comment: NEGATIVE
Comment: NORMAL
Neisseria Gonorrhea: NEGATIVE
Trichomonas: NEGATIVE

## 2021-10-26 MED ORDER — METRONIDAZOLE 500 MG PO TABS: 500.0000 mg | ORAL_TABLET | Freq: Two times a day (BID) | ORAL | 0 refills | Status: DC

## 2022-03-15 ENCOUNTER — Encounter: Payer: Self-pay | Admitting: Physician Assistant

## 2022-03-15 ENCOUNTER — Ambulatory Visit
Admission: EM | Admit: 2022-03-15 | Discharge: 2022-03-15 | Disposition: A | Discharge status: Home / Self Care | Payer: Self-pay | Attending: Physician Assistant | Admitting: Physician Assistant

## 2022-03-15 DIAGNOSIS — N898 Other specified noninflammatory disorders of vagina: Secondary | ICD-10-CM | POA: Insufficient documentation

## 2022-03-15 MED ORDER — METRONIDAZOLE 500 MG PO TABS: 500.0000 mg | ORAL_TABLET | Freq: Two times a day (BID) | ORAL | 0 refills | Status: AC

## 2022-03-15 NOTE — ED Provider Notes (Signed)
?[name] ? ? ? ?CSN: [id number] ?Arrival date & time: 03/15/22  0806 ? ? ?  ? ?History   ?Chief Complaint ?Chief Complaint  ?Patient presents with  ? Vaginal Discharge  ? ? ?HPI ?Tiffany Hoover is a 31 y.o. female.  ? ?Patient here today for evaluation of vaginal discharge with associated odor that started about a week ago. She does have history of BV. She denies any concerns for STDs. She has not had any other symptoms. ? ?The history is provided by the patient.  ? ?Past Medical History:  ?Diagnosis Date  ? Diabetes mellitus without complication (HCC)   ? History of gestational diabetes 11/26/2017  ? negative postpartum GTT after 2019 pregnancy  ? Preeclampsia 06/10/2018  ? Shoulder dystocia during labor and delivery, delivered 06/10/2018  ? 45-seconds  ? ? ?There are no problems to display for this patient. ? ? ?Past Surgical History:  ?Procedure Laterality Date  ? NO PAST SURGERIES    ? ? ?OB History   ? ? Gravida  ?3  ? Para  ?2  ? Term  ?2  ? Preterm  ?   ? AB  ?1  ? Living  ?2  ?  ? ? SAB  ?   ? IAB  ?1  ? Ectopic  ?   ? Multiple  ?0  ? Live Births  ?2  ?   ?  ?  ? ? ? ?Home Medications   ? ?Prior to Admission medications   ?Medication Sig Start Date End Date Taking? Authorizing Provider  ?metroNIDAZOLE (FLAGYL) 500 MG tablet Take 1 tablet (500 mg total) by mouth 2 (two) times daily for 7 days. 03/15/22 03/22/22  Tomi Bamberger, PA-C  ?amLODipine (NORVASC) 10 MG tablet Take 1 tablet (10 mg total) by mouth daily. 07/23/18 09/01/20  Constant, Peggy, MD  ?labetalol (NORMODYNE) 200 MG tablet Take 2 tablets (400 mg total) by mouth 2 (two) times daily. 07/23/18 09/01/20  Constant, Gigi Gin, MD  ? ? ?Family History ?Family History  ?Problem Relation Age of Onset  ? Diabetes Mother   ? Hypertension Mother   ? Hypertension Sister   ? Diabetes Maternal Aunt   ? Hypertension Maternal Aunt   ? ? ?Social History ?Social History  ? ?Tobacco Use  ? Smoking status: Never  ? Smokeless tobacco: Never  ?Vaping Use  ? Vaping  Use: Never used  ?Substance Use Topics  ? Alcohol use: No  ? Drug use: No  ? ? ? ?Allergies   ?Patient has no known allergies. ? ? ?Review of Systems ?Review of Systems  ?Constitutional:  Negative for chills and fever.  ?Eyes:  Negative for discharge and redness.  ?Gastrointestinal:  Negative for abdominal pain, nausea and vomiting.  ?Genitourinary:  Positive for vaginal discharge. Negative for vaginal bleeding.  ? ? ?Physical Exam ?Triage Vital Signs ?ED Triage Vitals  ?Enc Vitals Group  ?   BP   ?   Pulse   ?   Resp   ?   Temp   ?   Temp src   ?   SpO2   ?   Weight   ?   Height   ?   Head Circumference   ?   Peak Flow   ?   Pain Score   ?   Pain Loc   ?   Pain Edu?   ?   Excl. in GC?   ? ?No data found. ? ?Updated Vital Signs ?BP  118/79 (BP Location: Left Arm)   Pulse 95   Temp 99.5 ?F (37.5 ?C) (Oral)   Resp 18   LMP 03/03/2022   SpO2 98%  ?   ? ?Physical Exam ?Vitals and nursing note reviewed.  ?Constitutional:   ?   General: She is not in acute distress. ?   Appearance: Normal appearance. She is not ill-appearing.  ?HENT:  ?   Head: Normocephalic and atraumatic.  ?Eyes:  ?   Conjunctiva/sclera: Conjunctivae normal.  ?Cardiovascular:  ?   Rate and Rhythm: Normal rate.  ?Pulmonary:  ?   Effort: Pulmonary effort is normal.  ?Neurological:  ?   Mental Status: She is alert.  ?Psychiatric:     ?   Mood and Affect: Mood normal.     ?   Behavior: Behavior normal.     ?   Thought Content: Thought content normal.  ? ? ? ?UC Treatments / Results  ?Labs ?(all labs ordered are listed, but only abnormal results are displayed) ?Labs Reviewed  ?CERVICOVAGINAL ANCILLARY ONLY  ? ? ?EKG ? ? ?Radiology ?No results found. ? ?Procedures ?Procedures (including critical care time) ? ?Medications Ordered in UC ?Medications - No data to display ? ?Initial Impression / Assessment and Plan / UC Course  ?I have reviewed the triage vital signs and the nursing notes. ? ?Pertinent labs & imaging results that were available during my care  of the patient were reviewed by me and considered in my medical decision making (see chart for details). ? ?  ?Flagyl prescribed for treatment of presumed BV. Screening ordered for BV, Yeast, GC, Chlamydia and Trich. Encouraged follow up with any further concerns.  ? ?Final Clinical Impressions(s) / UC Diagnoses  ? ?Final diagnoses:  ?Vaginal discharge  ? ?Discharge Instructions   ?None ?  ? ?ED Prescriptions   ? ? Medication Sig Dispense Auth. Provider  ? metroNIDAZOLE (FLAGYL) 500 MG tablet Take 1 tablet (500 mg total) by mouth 2 (two) times daily for 7 days. 14 tablet Tomi Bamberger, PA-C  ? ?  ? ?PDMP not reviewed this encounter. ?  ?Tomi Bamberger, PA-C ?03/15/22 [phone] ? ?

## 2022-03-15 NOTE — ED Triage Notes (Signed)
Pt presents with vaginal discharge and odor X 1 week.  

## 2022-03-18 LAB — CERVICOVAGINAL ANCILLARY ONLY
Bacterial Vaginitis (gardnerella): POSITIVE — AB
Candida Glabrata: NEGATIVE
Candida Vaginitis: NEGATIVE
Chlamydia: NEGATIVE
Comment: NEGATIVE
Comment: NEGATIVE
Comment: NEGATIVE
Comment: NEGATIVE
Comment: NEGATIVE
Comment: NORMAL
Neisseria Gonorrhea: NEGATIVE
Trichomonas: NEGATIVE

## 2022-04-02 ENCOUNTER — Emergency Department (HOSPITAL_BASED_OUTPATIENT_CLINIC_OR_DEPARTMENT_OTHER)
Admission: EM | Admit: 2022-04-02 | Discharge: 2022-04-02 | Disposition: A | Discharge status: Home / Self Care | Payer: Self-pay | Attending: Emergency Medicine | Admitting: Emergency Medicine

## 2022-04-02 ENCOUNTER — Encounter (HOSPITAL_BASED_OUTPATIENT_CLINIC_OR_DEPARTMENT_OTHER): Payer: Self-pay | Admitting: Emergency Medicine

## 2022-04-02 ENCOUNTER — Other Ambulatory Visit: Payer: Self-pay

## 2022-04-02 DIAGNOSIS — J029 Acute pharyngitis, unspecified: Secondary | ICD-10-CM | POA: Insufficient documentation

## 2022-04-02 DIAGNOSIS — J3489 Other specified disorders of nose and nasal sinuses: Secondary | ICD-10-CM | POA: Insufficient documentation

## 2022-04-02 DIAGNOSIS — R0981 Nasal congestion: Secondary | ICD-10-CM | POA: Insufficient documentation

## 2022-04-02 DIAGNOSIS — Z20822 Contact with and (suspected) exposure to covid-19: Secondary | ICD-10-CM | POA: Insufficient documentation

## 2022-04-02 LAB — RESP PANEL BY RT-PCR (FLU A&B, COVID) ARPGX2
Influenza A by PCR: NEGATIVE
Influenza B by PCR: NEGATIVE
SARS Coronavirus 2 by RT PCR: NEGATIVE

## 2022-04-02 LAB — GROUP A STREP BY PCR: Group A Strep by PCR: NOT DETECTED

## 2022-04-02 MED ORDER — DEXAMETHASONE 4 MG PO TABS
10.0000 mg | ORAL_TABLET | Freq: Once | ORAL | Status: AC
  Administered 2022-04-02: 10 mg via ORAL
  Filled 2022-04-02: qty 3

## 2022-04-02 MED ORDER — LIDOCAINE VISCOUS HCL 2 % MT SOLN: 15.0000 mL | OROMUCOSAL | 0 refills | Status: AC | PRN

## 2022-04-02 MED ORDER — LIDOCAINE VISCOUS HCL 2 % MT SOLN
15.0000 mL | Freq: Once | OROMUCOSAL | Status: AC
  Administered 2022-04-02: 15 mL via OROMUCOSAL
  Filled 2022-04-02: qty 15

## 2022-04-02 MED ORDER — PENICILLIN G BENZATHINE 1200000 UNIT/2ML IM SUSY
1.2000 10*6.[IU] | PREFILLED_SYRINGE | Freq: Once | INTRAMUSCULAR | Status: AC
  Administered 2022-04-02: 1.2 10*6.[IU] via INTRAMUSCULAR
  Filled 2022-04-02: qty 2

## 2022-04-02 NOTE — Discharge Instructions (Addendum)
You were seen here today for evaluation of your sore throat.  You tested negative for strep throat, COVID, and the flu.  Your throat shows signs of pharyngitis so I am prophylactically treating you with an antibiotic and a steroid.  I am also sending you home within prescription for the viscous lidocaine to take as needed for your sore throat.  Please continue to keep your throat well-hydrated with lozenges or cough drops and please make sure you are staying well-hydrated plenty of fluids, mainly water.  Tylenol or ibuprofen as needed for pain.  You have problems swallowing, breathing, drooling, fever, increased pain, please return to the nearest emergency department for evaluation. ? ?Contact a doctor if: ?You have large, tender lumps in your neck. ?You have a rash. ?You cough up green, yellow-brown, or bloody spit. ?Get help right away if: ?You have a stiff neck. ?You drool or cannot swallow liquids. ?You cannot drink or take medicines without vomiting. ?You have very bad pain that does not go away with medicine. ?You have problems breathing, and it is not from a stuffy nose. ?You have new pain and swelling in your knees, ankles, wrists, or elbows. ?These symptoms may be an emergency. Get help right away. Call your local emergency services (911 in the [place] ?Do not wait to see if the symptoms will go away. ?Do not drive yourself to the hospital. ?

## 2022-04-02 NOTE — ED Notes (Signed)
Pt. C/o sore throat x one week with no enlarged tonsils.  Pt. Able to swallow with pain. ?

## 2022-04-02 NOTE — ED Provider Notes (Signed)
?[facility] EMERGENCY DEPARTMENT ?Provider Note ? ? ?CSN: [id number] ?Arrival date & time: 04/02/22  1514 ? ?  ? ?History ?Chief Complaint  ?Patient presents with  ? Sore Throat  ? ? ?Tiffany Hoover is a 32 y.o. female with h/o DM presents to the ED for evaluation of sore throat for the past week.  She also complained of some nasal congestion and rhinorrhea.  Denies any fevers, chest pain, shortness of breath, neck pain, body aches, abdominal pain, nausea, or fevers.  No medications trialed prior to arrival.  She denies any sick contacts.  Reports pain with swallowing but is still able to eat and drink normally. ? ? ?Sore Throat ? ? ?  ? ?Home Medications ?Prior to Admission medications   ?Medication Sig Start Date End Date Taking? Authorizing Provider  ?lidocaine (XYLOCAINE) 2 % solution Use as directed 15 mLs in the mouth or throat as needed for mouth pain. 04/02/22  Yes Achille Rich, PA-C  ?amLODipine (NORVASC) 10 MG tablet Take 1 tablet (10 mg total) by mouth daily. 07/23/18 09/01/20  Constant, Peggy, MD  ?labetalol (NORMODYNE) 200 MG tablet Take 2 tablets (400 mg total) by mouth 2 (two) times daily. 07/23/18 09/01/20  Constant, Gigi Gin, MD  ?   ? ?Allergies    ?Patient has no known allergies.   ? ?Review of Systems   ?Review of Systems  ?Constitutional:  Negative for fever.  ?HENT:  Positive for congestion, rhinorrhea and sore throat.   ? ?Physical Exam ?Updated Vital Signs ?BP 128/78 (BP Location: Left Arm)   Pulse 80   Temp 98.2 ?F (36.8 ?C) (Oral)   Resp 15   LMP 03/03/2022   SpO2 100%  ?Physical Exam ?Vitals and nursing note reviewed.  ?Constitutional:   ?   General: She is not in acute distress. ?   Appearance: She is not ill-appearing or toxic-appearing.  ?HENT:  ?   Right Ear: Tympanic membrane and ear canal normal.  ?   Left Ear: Tympanic membrane and ear canal normal.  ?   Nose: Congestion present.  ?   Mouth/Throat:  ?   Mouth: Mucous membranes are moist.  ?   Pharynx: Uvula midline.  ?   Tonsils:  Tonsillar exudate present.  ?   Comments: Mild pharyngeal erythema with bilateral tonsillar exudate.  Tonsils are symmetric.  Uvula is midline.  No soft palate deviation.  Moist mucous membranes.  Airway patent. ?Neck:  ?   Comments: Range of motion of neck without pain.  No cervical lymphadenopathy. ?Cardiovascular:  ?   Rate and Rhythm: Normal rate.  ?   Heart sounds: Normal heart sounds.  ?Pulmonary:  ?   Effort: Pulmonary effort is normal. No respiratory distress.  ?   Breath sounds: Normal breath sounds.  ?Abdominal:  ?   Palpations: Abdomen is soft.  ?   Tenderness: There is no abdominal tenderness.  ?Musculoskeletal:  ?   Cervical back: Normal range of motion.  ?Lymphadenopathy:  ?   Cervical: No cervical adenopathy.  ?Neurological:  ?   General: No focal deficit present.  ?   Mental Status: She is alert.  ? ? ?ED Results / Procedures / Treatments   ?Labs ?(all labs ordered are listed, but only abnormal results are displayed) ?Labs Reviewed  ?GROUP A STREP BY PCR  ?RESP PANEL BY RT-PCR (FLU A&B, COVID) ARPGX2  ? ? ?EKG ?None ? ?Radiology ?No results found. ? ?Procedures ?Procedures  ? ?Medications Ordered in ED ?Medications  ?  penicillin g benzathine (BICILLIN LA) 1200000 UNIT/2ML injection 1.2 Million Units (1.2 Million Units Intramuscular Given 04/02/22 1716)  ?dexamethasone (DECADRON) tablet 10 mg (10 mg Oral Given 04/02/22 1715)  ?lidocaine (XYLOCAINE) 2 % viscous mouth solution 15 mL (15 mLs Mouth/Throat Given 04/02/22 1715)  ? ? ?ED Course/ Medical Decision Making/ A&P ?  ?                        ?Medical Decision Making ?Risk ?Prescription drug management. ? ?31 year old female presents the emergency department for evaluation of sore throat for the past week with runny nose and nasal congestion.  Differential diagnosis includes was not limited to seasonal allergies, COVID, flu, viral illness, strep throat, PTA.  Vital signs are unremarkable.  Patient normotensive, afebrile, normal pulse rate, satting well  on room air without any increased work of breathing.  Physical exam is pertinent for mild pharyngeal erythema with bilateral tonsillar exudate.  No pharyngeal swelling.  Uvula midline.  No soft palate deviation.  Moist mucous membranes.  Airway is patent.  Full range of motion of neck.  No cervical lymphadenopathy present.  Normal TMs.  Nasal congestion present. ? ?Doubt PTA given reassuring physical exam findings. ? ?The patient tested negative for COVID, flu, and strep. ? ?Given the patient's physical exam, will prophylactically treat for pharyngitis.  I discussed with patient the oral medications versus shot.  Patient would like to receive the penicillin G shot.  I also gave her some Decadron and viscous lidocaine in the emergency department.  We will send her home with viscous mouthwash solution.  Recommended supportive cares and oral rehydration.  Strict return precautions were discussed with the patient.  Patient agrees to plan.  Patient is stable being discharged home in good condition. ? ?Final Clinical Impression(s) / ED Diagnoses ?Final diagnoses:  ?Pharyngitis, unspecified etiology  ? ? ?Rx / DC Orders ?ED Discharge Orders   ? ?      Ordered  ?  lidocaine (XYLOCAINE) 2 % solution  As needed       ? 04/02/22 1811  ? ?  ?  ? ?  ? ? ?  ?Achille Rich, PA-C ?04/05/22 1007 ? ?  ?Franne Forts, DO ?04/12/22 2340 ? ?

## 2022-04-02 NOTE — ED Triage Notes (Signed)
Pt reports sore throat and pain when swallowing that started 1 week ago. Pt denies fevers.  ?

## 2023-01-30 ENCOUNTER — Encounter (HOSPITAL_BASED_OUTPATIENT_CLINIC_OR_DEPARTMENT_OTHER): Payer: Self-pay | Admitting: Emergency Medicine

## 2023-01-30 ENCOUNTER — Other Ambulatory Visit: Payer: Self-pay

## 2023-01-30 ENCOUNTER — Emergency Department (HOSPITAL_BASED_OUTPATIENT_CLINIC_OR_DEPARTMENT_OTHER)
Admission: EM | Admit: 2023-01-30 | Discharge: 2023-01-30 | Disposition: A | Discharge status: Home / Self Care | Payer: Managed Care, Other (non HMO) | Attending: Emergency Medicine | Admitting: Emergency Medicine

## 2023-01-30 DIAGNOSIS — R059 Cough, unspecified: Secondary | ICD-10-CM | POA: Diagnosis present

## 2023-01-30 DIAGNOSIS — Z1152 Encounter for screening for COVID-19: Secondary | ICD-10-CM | POA: Insufficient documentation

## 2023-01-30 DIAGNOSIS — J069 Acute upper respiratory infection, unspecified: Secondary | ICD-10-CM | POA: Diagnosis not present

## 2023-01-30 LAB — RESP PANEL BY RT-PCR (RSV, FLU A&B, COVID)  RVPGX2
Influenza A by PCR: NEGATIVE
Influenza B by PCR: NEGATIVE
Resp Syncytial Virus by PCR: NEGATIVE
SARS Coronavirus 2 by RT PCR: NEGATIVE

## 2023-01-30 MED ORDER — ACETAMINOPHEN 325 MG PO TABS
650.0000 mg | ORAL_TABLET | Freq: Four times a day (QID) | ORAL | Status: DC | PRN
  Administered 2023-01-30: 650 mg via ORAL
  Filled 2023-01-30: qty 2

## 2023-01-30 MED ORDER — ONDANSETRON HCL 4 MG PO TABS: 4.0000 mg | ORAL_TABLET | Freq: Three times a day (TID) | ORAL | 0 refills | Status: AC | PRN

## 2023-01-30 NOTE — ED Triage Notes (Signed)
Patient c/o cough, chills, and body aches x2 days.

## 2023-01-30 NOTE — ED Notes (Signed)
Discharge paperwork reviewed entirely with patient, including Rx's and follow up care. Pain was under control. Pt verbalized understanding as well as all parties involved. No questions or concerns voiced at the time of discharge. No acute distress noted.   Pt ambulated out to PVA without incident or assistance.  

## 2023-01-30 NOTE — ED Provider Notes (Signed)
[place] [facility] Provider Note   CSN: [id number] Arrival date & time: 01/30/23  2049     History  Chief Complaint  Patient presents with   Cough    Tiffany Hoover Hoover is a 32 y.o. female who presents to ED c/o body aches, cough, congestion, fever, nausea, and chills x 3 days. She is concerned she has the flu. Both of her children have the same symptoms but theirs started earlier. She denies chest pain, shortness of breath, abdominal pain, or diarrhea. No history of lung disease.      Home Medications None  Allergies    Patient has no known allergies.    Review of Systems   Review of Systems  All other systems reviewed and are negative.   Physical Exam Updated Vital Signs BP 130/84 (BP Location: Right Arm)   Pulse 91   Temp (!) 100.5 F (38.1 C) (Oral)   Resp 17   Ht 5' 4"$  (1.626 m)   Wt 81.6 kg   LMP 01/14/2023 (Approximate)   SpO2 99%   BMI 30.90 kg/m  Physical Exam Vitals and nursing note reviewed.  Constitutional:      General: She is not in acute distress.    Appearance: Normal appearance. She is not toxic-appearing.  HENT:     Head: Normocephalic and atraumatic.     Nose: Congestion present.     Mouth/Throat:     Mouth: Mucous membranes are moist.     Dentition: No gingival swelling or dental abscesses.     Tongue: Tongue does not deviate from midline.     Pharynx: Oropharynx is clear. Uvula midline. No pharyngeal swelling, oropharyngeal exudate, posterior oropharyngeal erythema or uvula swelling.     Tonsils: No tonsillar exudate or tonsillar abscesses. 0 on the right. 0 on the left.  Eyes:     Conjunctiva/sclera: Conjunctivae normal.  Cardiovascular:     Rate and Rhythm: Normal rate and regular rhythm.     Heart sounds: No murmur heard. Pulmonary:     Effort: Pulmonary effort is normal. No respiratory distress.     Breath sounds: Normal breath sounds. No stridor. No wheezing, rhonchi or rales.  Abdominal:      General: Abdomen is flat.     Palpations: Abdomen is soft.     Tenderness: There is no abdominal tenderness. There is no guarding or rebound.  Musculoskeletal:        General: Normal range of motion.     Cervical back: Neck supple.     Right lower leg: No edema.     Left lower leg: No edema.  Skin:    General: Skin is warm and dry.     Capillary Refill: Capillary refill takes less than 2 seconds.  Neurological:     Mental Status: She is alert. Mental status is at baseline.  Psychiatric:        Behavior: Behavior normal.     ED Results / Procedures / Treatments   Labs (all labs ordered are listed, but only abnormal results are displayed) Labs Reviewed  RESP PANEL BY RT-PCR (RSV, FLU A&B, COVID)  RVPGX2    EKG None  Radiology No results found.  Procedures Procedures   Medications Ordered in ED Medications  acetaminophen (TYLENOL) tablet 650 mg (650 mg Oral Given 01/30/23 2332)    ED Course/ Medical Decision Making/ A&P  Medical Decision Making Amount and/or Complexity of Data Reviewed Labs: ordered. Decision-making details documented in ED Course.  Risk OTC drugs. Prescription drug management.   Medical Decision Making:   Tiffany Hoover Hoover is a 32 y.o. female who presented to the ED today with subjective fever, cough, congestion detailed above.    Patient's presentation is complicated by their history of positive sick contacts.   Complete initial physical exam performed, notably the patient  was hemodynamically stable in no acute distress.  Posterior oropharynx illuminated and without obvious swelling or deformity.  Patient is without neck stiffness. Lungs clear to auscultation. Abdomen soft and nontender.     Reviewed and confirmed nursing documentation for past medical history, family history, social history.    Initial Assessment:   With the patient's presentation of fever cough congestion, most likely diagnosis is developing viral upper  respiratory infection. Other diagnoses were considered including (but not limited to) peritonsillar abscess, retropharyngeal abscess, pneumonia, bronchitis. These are considered less likely due to history of present illness and physical exam findings.   This is most consistent with an acute complicated illness Considered meningitis, however patient's symptoms, vital signs, physical exam findings including lack of meningismus seem grossly less consistent at this time. Initial Plan:  Viral swabs Objective evaluation as reviewed   Initial Study Results:   Laboratory  All laboratory results reviewed without evidence of clinically relevant pathology.    Final Assessment and Plan:   On reassessment, patient is ambulatory and in no acute distress.   Pt's viral swabs negative, however, both of her daughters who have the same symptoms and are being treated in the ED at the same time tested positive for the flu.   Patient is currently stable for outpatient care and management with no indication for hospitalization or transfer at this time. She has no history of lung disease or significant comorbidities. Discussed all findings with patient expressed understanding.  Disposition:  Based on the above findings, I believe patient is stable for discharge.    Patient educated about specific return precautions for given chief complaint and symptoms.  Patient educated about follow-up with PCP.     Patient expressed understanding of return precautions and need for follow-up. Patient spoken to regarding all laboratory results and appropriate follow up for these results. All education provided in verbal form with additional information in written form. Time was allowed for answering of patient questions. Patient discharged. She is not a good candidate for anti-viral treatment due to length of symptoms, lack of comorbidities, and current mild disease.   Emergency Department Medication Summary:   Medications   acetaminophen (TYLENOL) tablet 650 mg (650 mg Oral Given 01/30/23 2332)           Clinical Impression:  1. Viral URI      Discharge    Final Clinical Impression(s) / ED Diagnoses Final diagnoses:  Viral URI    Rx / DC Orders ED Discharge Orders          Ordered    ondansetron (ZOFRAN) 4 MG tablet  Every 8 hours PRN        01/30/23 2326              Suzzette Righter, PA-C 01/31/23 0356    Margette Fast, MD 02/05/23 [phone]

## 2023-01-30 NOTE — Discharge Instructions (Signed)
Both of her kids tested positive for influenza.  Your swabs were negative, however, you likely still are suffering from influenza and will have similar treatment. Viral Illness TREATMENT  Treatment is directed at relieving symptoms. There is no cure. Antibiotics are not effective because the infection is caused by a virus, not by bacteria. Treatment may include:  Increased fluid intake. Sports drinks offer valuable electrolytes, sugars, and fluids.  Breathing heated mist or steam (vaporizer or shower).  Eating chicken soup or other clear broths, and maintaining good nutrition.  Getting plenty of rest.  Using gargles or lozenges for comfort.  Increasing usage of your inhaler if you have asthma.  Return to work when your temperature has returned to normal.  Gargle warm salt water and spit it out for sore throat. Take benadryl to decrease sinus secretions. Continue to alternate between Tylenol and ibuprofen for pain and fever control.  Follow Up: Follow up with your primary care doctor in 5-7 days for recheck of ongoing symptoms.  If you do not have a PCP I provided the names of 2 primary care clinics that you may contact to schedule an appointment.  Return to emergency department for emergent changing or worsening of symptoms.

## 2024-07-22 ENCOUNTER — Other Ambulatory Visit: Payer: Self-pay

## 2024-07-22 ENCOUNTER — Emergency Department (HOSPITAL_BASED_OUTPATIENT_CLINIC_OR_DEPARTMENT_OTHER): Admission: EM | Admit: 2024-07-22 | Discharge: 2024-07-22 | Disposition: A | Discharge status: Home / Self Care

## 2024-07-22 DIAGNOSIS — B009 Herpesviral infection, unspecified: Secondary | ICD-10-CM | POA: Insufficient documentation

## 2024-07-22 DIAGNOSIS — N898 Other specified noninflammatory disorders of vagina: Secondary | ICD-10-CM | POA: Diagnosis present

## 2024-07-22 DIAGNOSIS — E119 Type 2 diabetes mellitus without complications: Secondary | ICD-10-CM | POA: Insufficient documentation

## 2024-07-22 DIAGNOSIS — Z711 Person with feared health complaint in whom no diagnosis is made: Secondary | ICD-10-CM | POA: Diagnosis not present

## 2024-07-22 LAB — URINALYSIS, ROUTINE W REFLEX MICROSCOPIC
Bilirubin Urine: NEGATIVE
Glucose, UA: NEGATIVE mg/dL
Hgb urine dipstick: NEGATIVE
Ketones, ur: NEGATIVE mg/dL
Leukocytes,Ua: NEGATIVE
Nitrite: NEGATIVE
Protein, ur: NEGATIVE mg/dL
Specific Gravity, Urine: 1.02 (ref 1.005–1.030)
pH: 7 (ref 5.0–8.0)

## 2024-07-22 LAB — WET PREP, GENITAL
Clue Cells Wet Prep HPF POC: NONE SEEN
Sperm: NONE SEEN
Trich, Wet Prep: NONE SEEN
WBC, Wet Prep HPF POC: <10 (ref <10)
Yeast Wet Prep HPF POC: NONE SEEN

## 2024-07-22 LAB — PREGNANCY, URINE: Preg Test, Ur: NEGATIVE

## 2024-07-22 MED ORDER — VALACYCLOVIR HCL 500 MG PO TABS
1000.0000 mg | ORAL_TABLET | Freq: Once | ORAL | Status: AC
Start: 2024-07-22 — End: 2024-07-22
  Administered 2024-07-22: 1000 mg via ORAL
  Filled 2024-07-22: qty 2

## 2024-07-22 MED ORDER — VALACYCLOVIR HCL 1 G PO TABS
1000.0000 mg | ORAL_TABLET | Freq: Two times a day (BID) | ORAL | 0 refills | Status: AC
Start: 2024-07-22 — End: 2024-07-29

## 2024-07-22 NOTE — ED Provider Notes (Signed)
 [place] [facility] Provider Note   CSN: [id number] Arrival date & time: 07/22/24  9152     Patient presents with: Vaginal Soreness   Tiffany Hoover is a 33 y.o. female.   Patient with no pertinent past medical history presents today with complaints of vaginal soreness.  She reports that symptoms began 4 to 5 days ago.  Reports that the last time she had intercourse was on 7/29.  LMP ended on 7/28.  She is not on any form of birth control.  Denies any abdominal pain.  No vaginal discharge or dysuria.  No nausea, vomiting, or diarrhea.  Also reports that she has had some white bumps in her vaginal area as well.  Denies history of similar symptoms previously. These bumps are uncomfortable.  The history is provided by the patient. No language interpreter was used.       Prior to Admission medications   Medication Sig Start Date End Date Taking? Authorizing Provider  lidocaine  (XYLOCAINE ) 2 % solution Use as directed 15 mLs in the mouth or throat as needed for mouth pain. 04/02/22   Bernis Ernst, PA-C  amLODipine  (NORVASC ) 10 MG tablet Take 1 tablet (10 mg total) by mouth daily. 07/23/18 09/01/20  Constant, Peggy, MD  labetalol  (NORMODYNE ) 200 MG tablet Take 2 tablets (400 mg total) by mouth 2 (two) times daily. 07/23/18 09/01/20  Constant, Peggy, MD    Allergies: Patient has no known allergies.    Review of Systems  All other systems reviewed and are negative.   Updated Vital Signs BP (!) 141/93   Pulse 65   Temp 98.1 F (36.7 C) (Oral)   Resp 18   SpO2 100%   Physical Exam Vitals and nursing note reviewed.  Constitutional:      General: She is not in acute distress.    Appearance: Normal appearance. She is normal weight. She is not ill-appearing, toxic-appearing or diaphoretic.  HENT:     Head: Normocephalic and atraumatic.  Cardiovascular:     Rate and Rhythm: Normal rate.  Pulmonary:     Effort: Pulmonary effort is normal. No  respiratory distress.  Abdominal:     General: Abdomen is flat.     Palpations: Abdomen is soft.     Tenderness: There is no abdominal tenderness.  Genitourinary:    Comments: Grouped vesicular lesions noted to the left labial area.   Vagina with trace white discharge without signs or trauma or significant tenderness. No CMT or adnexal tenderness Musculoskeletal:        General: Normal range of motion.     Cervical back: Normal range of motion.  Skin:    General: Skin is warm and dry.  Neurological:     General: No focal deficit present.     Mental Status: She is alert.  Psychiatric:        Mood and Affect: Mood normal.        Behavior: Behavior normal.     (all labs ordered are listed, but only abnormal results are displayed) Labs Reviewed  WET PREP, GENITAL  HSV CULTURE AND TYPING  URINALYSIS, ROUTINE W REFLEX MICROSCOPIC  PREGNANCY, URINE  GC/CHLAMYDIA PROBE AMP ([place]) NOT AT [facility]    EKG: None  Radiology: No results found.   Procedures   Medications Ordered in the ED - No data to display  Medical Decision Making Amount and/or Complexity of Data Reviewed Labs: ordered.   This patient is a 33 y.o. female who presents to the ED for concern of vaginal discomfort, lesions, this involves an extensive number of treatment options, and is a complaint that carries with it a high risk of complications and morbidity. The emergent differential diagnosis prior to evaluation includes, but is not limited to,  syphilis, HSV, HPV, STD, trauma. This is not an exhaustive differential.   Past Medical History / Co-morbidities / Social History:  has a past medical history of Diabetes mellitus without complication (HCC), History of gestational diabetes (11/26/2017), Preeclampsia (06/10/2018), and Shoulder dystocia during labor and delivery, delivered (06/10/2018).  Additional history: Chart reviewed.  Physical Exam: Physical exam performed.  The pertinent findings include:   Grouped vesicular lesions noted to the left labial area.   Vagina with trace white discharge without signs or trauma or significant tenderness. No CMT or adnexal tenderness  Lab Tests: I ordered, and personally interpreted labs.  The pertinent results include: UA noninfectious, wet prep unremarkable.  U pregnant negative.  HSV and GC/chlamydia are pending  Medications: I ordered medication including Valtrex  for HSV   Disposition: After consideration of the diagnostic results and the patients response to treatment, I feel that emergency department workup does not suggest an emergent condition requiring admission or immediate intervention beyond what has been performed at this time. The plan is: Discharge with Valtrex  to cover for HSV, close outpatient follow-up and return precautions. 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  Discussed abstaining from sexual intercourse until these lesions have resolved.  Discussed that her culture and GC/Chlamydia swabs are pending.  No signs or symptoms to suggest PID.  No signs of vaginal trauma and no significant discomfort on exam.  Evaluation and diagnostic testing in the emergency department does not suggest an emergent condition requiring admission or immediate intervention beyond what has been performed at this time.  Plan for discharge with close PCP follow-up.  Patient is understanding and amenable with plan, educated on red flag symptoms that would prompt immediate return.  Patient discharged in stable condition.  Final diagnoses:  Concern about STD in female without diagnosis  Vaginal lesion    ED Discharge Orders          Ordered    valACYclovir  (VALTREX ) 1000 MG tablet  2 times daily        07/22/24 1042          An After Visit Summary was printed and given to the patient.      Nora Lauraine DELENA DEVONNA 07/22/24 1043    Ula Prentice SAUNDERS, MD 07/22/24 [phone]

## 2024-07-22 NOTE — Discharge Instructions (Signed)
 As we discussed, your gonorrhea chlamydia as well as your HSV swabs are pending at your discharge and take a few days to come back.  Given your symptoms, we have opted to go ahead and treat you for HSV.  We have given you the first dose of this medicine.  Please fill and take the rest as prescribed in its entirety.  Do not have sexual intercourse until these lesions have gone away.  Please call your OB/GYN to schedule a close follow-up appointment.  Return if development of any new or worsening symptoms.

## 2024-07-22 NOTE — ED Triage Notes (Signed)
 C/o vaginal soreness and small bumps x 4 days.

## 2024-07-23 LAB — GC/CHLAMYDIA PROBE AMP (~~LOC~~) NOT AT ARMC
Chlamydia: NEGATIVE
Comment: NEGATIVE
Comment: NORMAL
Neisseria Gonorrhea: NEGATIVE

## 2024-07-25 LAB — HSV CULTURE AND TYPING

## 2024-09-08 ENCOUNTER — Ambulatory Visit: Payer: Self-pay | Admitting: Internal Medicine

## 2024-09-08 NOTE — Progress Notes (Deleted)
  [facility]ARE [facility] [address] [place] [address] Dept: [phone] Dept Fax: [phone]  New Patient Office Visit  Subjective:   Tiffany Hoover September 10, 1991 09/08/2024  No chief complaint on file.   HPI: Tiffany Hoover presents today to establish care at [facility]. Introduced to Publishing rights manager role and practice setting.  All questions answered.  Concerns: See below    The following portions of the patient's history were reviewed and updated as appropriate: past medical history, past surgical history, family history, social history, allergies, medications, and problem list.   There are no active problems to display for this patient.  Past Medical History:  Diagnosis Date   Diabetes mellitus without complication (HCC)    History of gestational diabetes 11/26/2017   negative postpartum GTT after 2019 pregnancy   Preeclampsia 06/10/2018   Shoulder dystocia during labor and delivery, delivered 06/10/2018   45-seconds   Past Surgical History:  Procedure Laterality Date   NO PAST SURGERIES     Family History  Problem Relation Age of Onset   Diabetes Mother    Hypertension Mother    Hypertension Sister    Diabetes Maternal Aunt    Hypertension Maternal Aunt     Current Outpatient Medications:    lidocaine  (XYLOCAINE ) 2 % solution, Use as directed 15 mLs in the mouth or throat as needed for mouth pain., Disp: 100 mL, Rfl: 0 No Known Allergies  ROS: A complete ROS was performed with pertinent positives/negatives noted in the HPI. The remainder of the ROS are negative.   Objective:   There were no vitals filed for this visit.  GENERAL: Well-appearing, in NAD. Well nourished.  SKIN: Pink, warm and dry. No rash, lesion, ulceration, or ecchymoses.  NECK: Trachea midline. Full ROM w/o pain or tenderness. No lymphadenopathy.  RESPIRATORY: Chest wall symmetrical. Respirations even and  non-labored. Breath sounds clear to auscultation bilaterally.  CARDIAC: S1, S2 present, regular rate and rhythm. Peripheral pulses 2+ bilaterally.  EXTREMITIES: Without clubbing, cyanosis, or edema.  NEUROLOGIC: No motor or sensory deficits. Steady, even gait.  PSYCH/MENTAL STATUS: Alert, oriented x 3. Cooperative, appropriate mood and affect.   Health Maintenance Due  Topic Date Due   Hepatitis C Screening  Never done   Hepatitis B Vaccines 19-59 Average Risk (1 of 3 - 19+ 3-dose series) Never done   HPV VACCINES (1 - 3-dose SCDM series) Never done   Cervical Cancer Screening (HPV/Pap Cotest)  11/22/2021   Influenza Vaccine  Never done   COVID-19 Vaccine (1 - 2024-25 season) Never done    No results found for any visits on 09/08/24.  Assessment & Plan:   There are no diagnoses linked to this encounter.  No orders of the defined types were placed in this encounter.  No orders of the defined types were placed in this encounter.   No follow-ups on file.   Rosina Senters, FNP
